# Patient Record
Sex: Female | Born: 1960
Health system: Southern US, Community
[De-identification: ages and names within clinical notes are randomized; demographics above are authoritative.]

## PROBLEM LIST (undated history)

## (undated) DIAGNOSIS — J45909 Unspecified asthma, uncomplicated: Secondary | ICD-10-CM

## (undated) DIAGNOSIS — R112 Nausea with vomiting, unspecified: Secondary | ICD-10-CM

## (undated) DIAGNOSIS — Z85828 Personal history of other malignant neoplasm of skin: Secondary | ICD-10-CM

## (undated) DIAGNOSIS — K219 Gastro-esophageal reflux disease without esophagitis: Secondary | ICD-10-CM

## (undated) DIAGNOSIS — M1990 Unspecified osteoarthritis, unspecified site: Secondary | ICD-10-CM

## (undated) DIAGNOSIS — T8859XA Other complications of anesthesia, initial encounter: Secondary | ICD-10-CM

## (undated) HISTORY — PX: CHOLECYSTECTOMY: SHX55

## (undated) HISTORY — DX: Personal history of other malignant neoplasm of skin: Z85.828

## (undated) HISTORY — PX: COLON SURGERY: SHX602

## (undated) HISTORY — PX: PLACEMENT OF BREAST IMPLANTS: SHX6334

## (undated) HISTORY — PX: APPENDECTOMY: SHX54

---

## 2006-05-08 DIAGNOSIS — C4491 Basal cell carcinoma of skin, unspecified: Secondary | ICD-10-CM | POA: Insufficient documentation

## 2006-07-09 DIAGNOSIS — R202 Paresthesia of skin: Secondary | ICD-10-CM | POA: Insufficient documentation

## 2006-11-03 HISTORY — PX: AUGMENTATION MAMMAPLASTY: SUR837

## 2007-11-04 DIAGNOSIS — Z85828 Personal history of other malignant neoplasm of skin: Secondary | ICD-10-CM

## 2007-11-04 HISTORY — DX: Personal history of other malignant neoplasm of skin: Z85.828

## 2011-07-10 DIAGNOSIS — J45909 Unspecified asthma, uncomplicated: Secondary | ICD-10-CM | POA: Insufficient documentation

## 2011-07-10 DIAGNOSIS — N943 Premenstrual tension syndrome: Secondary | ICD-10-CM | POA: Insufficient documentation

## 2011-07-10 DIAGNOSIS — K589 Irritable bowel syndrome without diarrhea: Secondary | ICD-10-CM | POA: Insufficient documentation

## 2011-10-11 DIAGNOSIS — D696 Thrombocytopenia, unspecified: Secondary | ICD-10-CM | POA: Insufficient documentation

## 2013-04-27 DIAGNOSIS — K118 Other diseases of salivary glands: Secondary | ICD-10-CM | POA: Insufficient documentation

## 2013-05-03 DIAGNOSIS — R7989 Other specified abnormal findings of blood chemistry: Secondary | ICD-10-CM | POA: Insufficient documentation

## 2013-08-22 DIAGNOSIS — Z87410 Personal history of cervical dysplasia: Secondary | ICD-10-CM | POA: Insufficient documentation

## 2013-12-22 DIAGNOSIS — R1013 Epigastric pain: Secondary | ICD-10-CM | POA: Insufficient documentation

## 2013-12-22 DIAGNOSIS — L719 Rosacea, unspecified: Secondary | ICD-10-CM | POA: Insufficient documentation

## 2013-12-22 DIAGNOSIS — K219 Gastro-esophageal reflux disease without esophagitis: Secondary | ICD-10-CM | POA: Insufficient documentation

## 2014-03-07 ENCOUNTER — Emergency Department: Payer: Self-pay | Admitting: Emergency Medicine

## 2014-03-07 LAB — CBC
HCT: 41.3 % (ref 35.0–47.0)
HGB: 13.7 g/dL (ref 12.0–16.0)
MCH: 30.8 pg (ref 26.0–34.0)
MCHC: 33.1 g/dL (ref 32.0–36.0)
MCV: 93 fL (ref 80–100)
Platelet: 140 10*3/uL — ABNORMAL LOW (ref 150–440)
RBC: 4.43 10*6/uL (ref 3.80–5.20)
RDW: 12.9 % (ref 11.5–14.5)
WBC: 4.2 10*3/uL (ref 3.6–11.0)

## 2014-03-07 LAB — BASIC METABOLIC PANEL
ANION GAP: 5 — AB (ref 7–16)
BUN: 20 mg/dL — ABNORMAL HIGH (ref 7–18)
CALCIUM: 8.5 mg/dL (ref 8.5–10.1)
CHLORIDE: 109 mmol/L — AB (ref 98–107)
CO2: 27 mmol/L (ref 21–32)
CREATININE: 0.92 mg/dL (ref 0.60–1.30)
EGFR (African American): >60
Glucose: 100 mg/dL — ABNORMAL HIGH (ref 65–99)
OSMOLALITY: 284 (ref 275–301)
Potassium: 3.6 mmol/L (ref 3.5–5.1)
SODIUM: 141 mmol/L (ref 136–145)

## 2014-03-07 LAB — TROPONIN I: Troponin-I: <0.02 ng/mL

## 2014-05-17 ENCOUNTER — Ambulatory Visit: Payer: Self-pay | Admitting: Family Medicine

## 2014-10-11 DIAGNOSIS — Z780 Asymptomatic menopausal state: Secondary | ICD-10-CM | POA: Insufficient documentation

## 2014-11-28 ENCOUNTER — Ambulatory Visit: Payer: Self-pay | Admitting: Family Medicine

## 2015-02-11 DIAGNOSIS — K5792 Diverticulitis of intestine, part unspecified, without perforation or abscess without bleeding: Secondary | ICD-10-CM | POA: Insufficient documentation

## 2015-06-25 DIAGNOSIS — Z8041 Family history of malignant neoplasm of ovary: Secondary | ICD-10-CM | POA: Insufficient documentation

## 2015-07-25 ENCOUNTER — Inpatient Hospital Stay: Payer: BLUE CROSS/BLUE SHIELD

## 2015-07-25 ENCOUNTER — Other Ambulatory Visit: Payer: Self-pay

## 2015-07-25 ENCOUNTER — Inpatient Hospital Stay
Admission: EM | Admit: 2015-07-25 | Discharge: 2015-07-27 | DRG: 390 | Disposition: A | Discharge status: Home / Self Care | Payer: BLUE CROSS/BLUE SHIELD | Attending: General Surgery | Admitting: General Surgery

## 2015-07-25 ENCOUNTER — Emergency Department: Payer: BLUE CROSS/BLUE SHIELD

## 2015-07-25 DIAGNOSIS — D259 Leiomyoma of uterus, unspecified: Secondary | ICD-10-CM | POA: Diagnosis present

## 2015-07-25 DIAGNOSIS — K5669 Other intestinal obstruction: Principal | ICD-10-CM

## 2015-07-25 DIAGNOSIS — J45909 Unspecified asthma, uncomplicated: Secondary | ICD-10-CM | POA: Diagnosis present

## 2015-07-25 DIAGNOSIS — K589 Irritable bowel syndrome without diarrhea: Secondary | ICD-10-CM | POA: Diagnosis present

## 2015-07-25 DIAGNOSIS — Z9049 Acquired absence of other specified parts of digestive tract: Secondary | ICD-10-CM | POA: Diagnosis present

## 2015-07-25 DIAGNOSIS — K56609 Unspecified intestinal obstruction, unspecified as to partial versus complete obstruction: Secondary | ICD-10-CM | POA: Diagnosis present

## 2015-07-25 HISTORY — DX: Unspecified asthma, uncomplicated: J45.909

## 2015-07-25 LAB — COMPREHENSIVE METABOLIC PANEL
ALK PHOS: 70 U/L (ref 38–126)
ALT: 19 U/L (ref 14–54)
AST: 30 U/L (ref 15–41)
Albumin: 4.3 g/dL (ref 3.5–5.0)
Anion gap: 7 (ref 5–15)
BUN: 12 mg/dL (ref 6–20)
CALCIUM: 9.2 mg/dL (ref 8.9–10.3)
CO2: 27 mmol/L (ref 22–32)
CREATININE: 0.88 mg/dL (ref 0.44–1.00)
Chloride: 108 mmol/L (ref 101–111)
Glucose, Bld: 137 mg/dL — ABNORMAL HIGH (ref 65–99)
Potassium: 3.5 mmol/L (ref 3.5–5.1)
SODIUM: 142 mmol/L (ref 135–145)
Total Bilirubin: 1.6 mg/dL — ABNORMAL HIGH (ref 0.3–1.2)
Total Protein: 7.2 g/dL (ref 6.5–8.1)

## 2015-07-25 LAB — CBC WITH DIFFERENTIAL/PLATELET
Basophils Absolute: 0 10*3/uL (ref 0–0.1)
Basophils Relative: 1 %
Eosinophils Absolute: 0.1 10*3/uL (ref 0–0.7)
Eosinophils Relative: 1 %
HCT: 43.2 % (ref 35.0–47.0)
HEMOGLOBIN: 14.7 g/dL (ref 12.0–16.0)
LYMPHS ABS: 1.1 10*3/uL (ref 1.0–3.6)
LYMPHS PCT: 23 %
MCH: 31.4 pg (ref 26.0–34.0)
MCHC: 34 g/dL (ref 32.0–36.0)
MCV: 92.3 fL (ref 80.0–100.0)
Monocytes Absolute: 0.2 10*3/uL (ref 0.2–0.9)
Monocytes Relative: 5 %
NEUTROS PCT: 70 %
Neutro Abs: 3.2 10*3/uL (ref 1.4–6.5)
Platelets: 147 10*3/uL — ABNORMAL LOW (ref 150–440)
RBC: 4.68 MIL/uL (ref 3.80–5.20)
RDW: 12.6 % (ref 11.5–14.5)
WBC: 4.6 10*3/uL (ref 3.6–11.0)

## 2015-07-25 LAB — URINALYSIS COMPLETE WITH MICROSCOPIC (ARMC ONLY)
BILIRUBIN URINE: NEGATIVE
Bacteria, UA: NONE SEEN
Glucose, UA: NEGATIVE mg/dL
Hgb urine dipstick: NEGATIVE
Ketones, ur: NEGATIVE mg/dL
Nitrite: NEGATIVE
PH: 9 — AB (ref 5.0–8.0)
PROTEIN: NEGATIVE mg/dL
RBC / HPF: NONE SEEN RBC/hpf (ref 0–5)
Specific Gravity, Urine: 1.013 (ref 1.005–1.030)

## 2015-07-25 LAB — LIPASE, BLOOD: Lipase: 33 U/L (ref 22–51)

## 2015-07-25 LAB — TROPONIN I

## 2015-07-25 MED ORDER — PROMETHAZINE HCL 25 MG/ML IJ SOLN
12.5000 mg | Freq: Once | INTRAMUSCULAR | Status: AC
  Administered 2015-07-25: 12.5 mg via INTRAVENOUS

## 2015-07-25 MED ORDER — SODIUM CHLORIDE 0.9 % IV SOLN: 8.0000 mg | Freq: Once | INTRAVENOUS | Status: AC

## 2015-07-25 MED ORDER — HYDROMORPHONE HCL 1 MG/ML IJ SOLN
INTRAMUSCULAR | Status: AC
  Administered 2015-07-25: 0.5 mg via INTRAVENOUS
  Filled 2015-07-25: qty 1

## 2015-07-25 MED ORDER — HYDROMORPHONE HCL 1 MG/ML IJ SOLN: 1.0000 mg | Freq: Once | INTRAMUSCULAR | Status: DC

## 2015-07-25 MED ORDER — ONDANSETRON HCL 4 MG/2ML IJ SOLN
INTRAMUSCULAR | Status: AC
  Administered 2015-07-25: 4 mg
  Filled 2015-07-25: qty 2

## 2015-07-25 MED ORDER — MORPHINE SULFATE (PF) 4 MG/ML IV SOLN
INTRAVENOUS | Status: AC
  Administered 2015-07-25: 4 mg via INTRAVENOUS
  Filled 2015-07-25: qty 1

## 2015-07-25 MED ORDER — ENOXAPARIN SODIUM 40 MG/0.4ML ~~LOC~~ SOLN
40.0000 mg | SUBCUTANEOUS | Status: DC
  Administered 2015-07-25 – 2015-07-27 (×3): 40 mg via SUBCUTANEOUS
  Filled 2015-07-25 (×3): qty 0.4

## 2015-07-25 MED ORDER — PROMETHAZINE HCL 25 MG/ML IJ SOLN
INTRAMUSCULAR | Status: AC
  Administered 2015-07-25: 12.5 mg via INTRAVENOUS
  Filled 2015-07-25: qty 1

## 2015-07-25 MED ORDER — ACETAMINOPHEN 650 MG RE SUPP
650.0000 mg | Freq: Four times a day (QID) | RECTAL | Status: DC | PRN
Start: 2015-07-25 — End: 2015-07-27

## 2015-07-25 MED ORDER — HYDROMORPHONE HCL 1 MG/ML IJ SOLN
INTRAMUSCULAR | Status: AC
  Administered 2015-07-25: 1 mg via INTRAVENOUS
  Filled 2015-07-25: qty 1

## 2015-07-25 MED ORDER — ONDANSETRON HCL 4 MG/2ML IJ SOLN
4.0000 mg | Freq: Four times a day (QID) | INTRAMUSCULAR | Status: DC | PRN
  Administered 2015-07-26: 4 mg via INTRAVENOUS
  Filled 2015-07-25 (×2): qty 2

## 2015-07-25 MED ORDER — HYDROMORPHONE HCL 1 MG/ML IJ SOLN
0.5000 mg | Freq: Once | INTRAMUSCULAR | Status: AC
  Administered 2015-07-25: 0.5 mg via INTRAVENOUS

## 2015-07-25 MED ORDER — DEXTROSE IN LACTATED RINGERS 5 % IV SOLN
INTRAVENOUS | Status: DC
  Administered 2015-07-25 – 2015-07-27 (×4): via INTRAVENOUS

## 2015-07-25 MED ORDER — HYDROMORPHONE HCL 1 MG/ML IJ SOLN
1.0000 mg | INTRAMUSCULAR | Status: DC | PRN
  Administered 2015-07-25 – 2015-07-27 (×9): 1 mg via INTRAVENOUS
  Filled 2015-07-25 (×8): qty 1

## 2015-07-25 MED ORDER — IOHEXOL 240 MG/ML SOLN
25.0000 mL | Freq: Once | INTRAMUSCULAR | Status: AC | PRN
  Administered 2015-07-25: 25 mL via ORAL

## 2015-07-25 MED ORDER — ACETAMINOPHEN 325 MG PO TABS
650.0000 mg | ORAL_TABLET | Freq: Four times a day (QID) | ORAL | Status: DC | PRN
Start: 2015-07-25 — End: 2015-07-27

## 2015-07-25 MED ORDER — ONDANSETRON HCL 4 MG PO TABS
4.0000 mg | ORAL_TABLET | Freq: Four times a day (QID) | ORAL | Status: DC | PRN
  Administered 2015-07-27: 4 mg via ORAL

## 2015-07-25 MED ORDER — MORPHINE SULFATE (PF) 4 MG/ML IV SOLN
4.0000 mg | Freq: Once | INTRAVENOUS | Status: AC
  Administered 2015-07-25: 4 mg via INTRAVENOUS

## 2015-07-25 MED ORDER — IOHEXOL 300 MG/ML  SOLN
100.0000 mL | Freq: Once | INTRAMUSCULAR | Status: AC | PRN
  Administered 2015-07-25: 100 mL via INTRAVENOUS

## 2015-07-25 NOTE — ED Notes (Addendum)
Pt calls out to report that she has vomited her contrast and having increased right sided upper abd pain, sharp and cramping; pt grimacing, restless; pt up to room commode to urinate; instructed on cc urine collection

## 2015-07-25 NOTE — ED Notes (Signed)
Pt returns to room, no distress noted; reports pain decreased 4/10 and much more comfortable; card monitor replaced

## 2015-07-25 NOTE — ED Notes (Signed)
Dr Owens Shark at bedside; pt placed on card monitor

## 2015-07-25 NOTE — ED Notes (Signed)
CT tech at bedside to bring pt new bottle contrast to attempt to drink

## 2015-07-25 NOTE — ED Notes (Signed)
Pt presents to ED with sudden onset of severe epigastric pain and vomiting (X2) that started approx 20 minutes ago. No hx of the same. Pt reports her gallbladder and appendix have been removed. Elective surgery approx 4 months ago to have a portion of her colon removed due to reoccurring diverticulitis. Nothing seems to make pain improve per pt. Pt very restless and moaning.

## 2015-07-25 NOTE — ED Notes (Signed)
Pt to CT via stretcher

## 2015-07-25 NOTE — H&P (Signed)
Kristy Gibson is an 54 y.o. female.     Chief Complaint: sudden onset abdominal pain and emesis  HPI: This is a 54 year old otherwise healthy white female with a history of recurrent complicated diverticulitis of the sigmoid colon who underwent laparoscopic-assisted sigmoid colectomy at [facility] in June of this year. Her postoperative course was unremarkable. She has a history of intermittent symptoms of IBS for which she is on prescription medication for. She also has a history of uterine fibroids.  She awoke from sleep at approximately 1:00 this morning going to bed feeling fine eating a salad the night before with no sick contacts with the severe sudden onset of diffuse abdominal pain more so in the upper abdomen associated with multiple episodes of emesis of gastric contents. She's brought by her husband to the emergency room. Workup in the emergency room demonstrates a distraught lady.  A CT scan was obtained with oral and intravenous contrast which I have reviewed demonstrating a proximal small bowel obstruction with obvious transition point. As such surgical services were contacted. Last bowel movement and passage of flatus was at 10:00 p.m. last evening.  Past Medical History  Diagnosis Date  . Asthma   IBS  Past surgical history: significant for laparoscopic-assisted sigmoid colectomy performed at [facility] in June 2016.   Laparoscopic cholecystectomy  Family history is negative For cancer on her mother's side.  Social History:  has no tobacco, alcohol, and drug history on file.  Allergies: No Known Allergies.   Review of Systems  Constitutional: Negative for fever, chills, weight loss and malaise/fatigue.  HENT: Negative.   Respiratory: Negative for cough and hemoptysis.   Cardiovascular: Negative for chest pain and palpitations.  Gastrointestinal: Positive for nausea, vomiting and abdominal pain. Negative for heartburn, diarrhea and  constipation.  Genitourinary: Negative.  Negative for dysuria and urgency.  Skin: Negative for itching and rash.  Neurological: Positive for weakness.  Endo/Heme/Allergies: Negative.   Psychiatric/Behavioral: Negative.     Physical Exam:  Physical Exam  Constitutional: She is oriented to person, place, and time. She appears distressed.  HENT:  Head: Normocephalic and atraumatic.  Eyes: Conjunctivae are normal. Pupils are equal, round, and reactive to light.  Neck: Normal range of motion. Neck supple.  Cardiovascular: Normal rate and regular rhythm.   Pulmonary/Chest: Breath sounds normal. No respiratory distress. She has no wheezes.  Abdominal: Normal appearance. She exhibits no distension and no mass. Bowel sounds are hyperactive. There is no hepatosplenomegaly. There is generalized tenderness. There is no rebound, no guarding and no CVA tenderness. No hernia.    Neurological: She is oriented to person, place, and time.  Skin: Skin is warm and dry. She is not diaphoretic.  Psychiatric: Mood, memory, affect and judgment normal.    Blood pressure 99/65, pulse 58, temperature 97.4 F (36.3 C), temperature source Oral, resp. rate 20, height 5' 4" (1.626 m), weight 130 lb (58.968 kg), SpO2 99 %.  Results for orders placed or performed during the hospital encounter of 07/25/15 (from the past 48 hour(s))  CBC WITH DIFFERENTIAL     Status: Abnormal   Collection Time: 07/25/15  3:28 AM  Result Value Ref Range   WBC 4.6 3.6 - 11.0 K/uL   RBC 4.68 3.80 - 5.20 MIL/uL   Hemoglobin 14.7 12.0 - 16.0 g/dL   HCT 43.2 35.0 - 47.0 %   MCV 92.3 80.0 - 100.0 fL   MCH 31.4 26.0 - 34.0 pg   MCHC 34.0  32.0 - 36.0 g/dL   RDW 12.6 11.5 - 14.5 %   Platelets 147 (L) 150 - 440 K/uL   Neutrophils Relative % 70 %   Neutro Abs 3.2 1.4 - 6.5 K/uL   Lymphocytes Relative 23 %   Lymphs Abs 1.1 1.0 - 3.6 K/uL   Monocytes Relative 5 %   Monocytes Absolute 0.2 0.2 - 0.9 K/uL   Eosinophils Relative 1 %    Eosinophils Absolute 0.1 0 - 0.7 K/uL   Basophils Relative 1 %   Basophils Absolute 0.0 0 - 0.1 K/uL  Comprehensive metabolic panel     Status: Abnormal   Collection Time: 07/25/15  3:28 AM  Result Value Ref Range   Sodium 142 135 - 145 mmol/L   Potassium 3.5 3.5 - 5.1 mmol/L   Chloride 108 101 - 111 mmol/L   CO2 27 22 - 32 mmol/L   Glucose, Bld 137 (H) 65 - 99 mg/dL   BUN 12 6 - 20 mg/dL   Creatinine, Ser 0.88 0.44 - 1.00 mg/dL   Calcium 9.2 8.9 - 10.3 mg/dL   Total Protein 7.2 6.5 - 8.1 g/dL   Albumin 4.3 3.5 - 5.0 g/dL   AST 30 15 - 41 U/L   ALT 19 14 - 54 U/L   Alkaline Phosphatase 70 38 - 126 U/L   Total Bilirubin 1.6 (H) 0.3 - 1.2 mg/dL   GFR calc non Af Amer >60 >60 mL/min   GFR calc Af Amer >60 >60 mL/min    Comment: (NOTE) The eGFR has been calculated using the CKD EPI equation. This calculation has not been validated in all clinical situations. eGFR's persistently <60 mL/min signify possible Chronic Kidney Disease.    Anion gap 7 5 - 15  Lipase, blood     Status: None   Collection Time: 07/25/15  3:28 AM  Result Value Ref Range   Lipase 33 22 - 51 U/L  Troponin I     Status: None   Collection Time: 07/25/15  3:28 AM  Result Value Ref Range   Troponin I <0.03 <0.031 ng/mL    Comment:        NO INDICATION OF MYOCARDIAL INJURY.   Urinalysis complete, with microscopic ([facility] only)     Status: Abnormal   Collection Time: 07/25/15  3:37 AM  Result Value Ref Range   Color, Urine YELLOW (A) YELLOW   APPearance CLOUDY (A) CLEAR   Glucose, UA NEGATIVE NEGATIVE mg/dL   Bilirubin Urine NEGATIVE NEGATIVE   Ketones, ur NEGATIVE NEGATIVE mg/dL   Specific Gravity, Urine 1.013 1.005 - 1.030   Hgb urine dipstick NEGATIVE NEGATIVE   pH 9.0 (H) 5.0 - 8.0   Protein, ur NEGATIVE NEGATIVE mg/dL   Nitrite NEGATIVE NEGATIVE   Leukocytes, UA TRACE (A) NEGATIVE   RBC / HPF NONE SEEN 0 - 5 RBC/hpf   WBC, UA 0-5 0 - 5 WBC/hpf   Bacteria, UA NONE SEEN NONE SEEN   Squamous  Epithelial / LPF 0-5 (A) NONE SEEN   Mucous PRESENT    Amorphous Crystal PRESENT    Ct Abdomen Pelvis W Contrast  07/25/2015   CLINICAL DATA:  Generalized abdominal pain with vomiting.  EXAM: CT ABDOMEN AND PELVIS WITH CONTRAST  TECHNIQUE: Multidetector CT imaging of the abdomen and pelvis was performed using the standard protocol following bolus administration of intravenous contrast.  CONTRAST:  25mL OMNIPAQUE IOHEXOL 240 MG/ML SOLN, 100mL OMNIPAQUE IOHEXOL 300 MG/ML SOLN  COMPARISON:  11/28/2014  FINDINGS:   Lower chest and abdominal wall:  No contributory findings.  Hepatobiliary: Benign-appearing tiny low densities in the liver which could reflect cysts or biliary hamartomas.Cholecystectomy with stable common bile duct at 7 mm. Low insertion cystic duct, joining at the upper pancreatic head. No visible choledocholithiasis.  Pancreas: Unremarkable.  Spleen: Unremarkable.  Adrenals/Urinary Tract: Negative adrenals. No hydronephrosis or stone. An 11 mm mass contiguous with the lower pole kidney is indeterminate between colonic diverticulum or renal carcinoma. The nodule is newly seen and does not have a clear renal epicenter on reformats. These features favors diverticulum with high-density debris, but no other diverticula in the region and no diverticula at this location appearance on previous CT.  Unremarkable bladder.  Reproductive:Prominent left ovarian vein, with appearance and partial opacification stable from prior. Multiple uterine fibroids, largest measuring 23 mm. No adnexal mass seen.  Stomach/Bowel: There is a cluster of small bowel in the left upper quadrant with angulation and mesenteric congestion/edema. Small bowel loops proximal to this area have abrupt transition and distention with fluid level. Transition best visualized on series 7, image 106 and on series 4, image 19 . No necrotic appearing bowel. Small pelvic fluid, likely reactive.  Interval sigmoid resection.  Vascular/Lymphatic: No  acute vascular abnormality. No mass or adenopathy.  Peritoneal: No pneumoperitoneum.  Musculoskeletal: No acute abnormalities.  IMPRESSION: 1. Proximal small bowel obstruction with transition point in the left upper quadrant, internal hernia versus adhesion. Small bowel distention is mild, but likely from early obstruction as the transition point appears high-grade. 2. 11 mm mass at the lower pole right kidney, indeterminate as discussed above. Recommend renal protocol MRI after convalescence.   Electronically Signed   By: Jonathon  Watts M.D.   On: 07/25/2015 05:15     Assessment/Plan 54-year-old white female with what appears to be proximal jejunal adhesive small bowel obstruction. Normal white count. Nasogastric tube has been placed. I do not find an indication for acute surgical intervention at this time. I discussed with the patient and her husband the possibility of her needing an operative intervention based on the time course and progression of her symptoms over the next 24-36 hours. I told him that transfer to [facility] if indeed they want any type of surgery done there would be much easier done through the emergency room rather than at the time the decision for surgery is made along her admission to this hospital. They understand and wish to be admitted to this hospital with the understanding that there is a high chance that nonoperative intervention will work. I did describe to him however that time course of events and with the appearance of the CT scan is impossible to predict whether nonoperative therapy will be successful 100% at this time.  Mark A Bird, MD, FACS 

## 2015-07-25 NOTE — ED Provider Notes (Signed)
[facility] Emergency Department Provider Note  ____________________________________________  Time seen: 2:45 AM  I have reviewed the triage vital signs and the nursing notes.   HISTORY  Chief Complaint Abdominal Pain      HPI Coraline Talwar is a 54 y.o. female presents with acute onset of generalized abdominal pain and vomiting 2 with onset 20 minutes ago. Patient rates that she had elective partial colectomy performed 4 months ago secondary to diverticulitis at [facility]. Patient denies any alleviating or aggravating factors for her pain. Patient states that she had a CAT scan performed 2 months ago for ongoing abdominal pain following her surgery which did not reveal any clear etiology for her pain.     Past medical history Appendicitis Diverticulitis There are no active problems to display for this patient.   Past surgical history Appendectomy Cholecystectomy Hemicolectomy No current outpatient prescriptions on file.  Allergies No known drug allergies No family history on file.  Social History Social History  Substance Use Topics  . Smoking status: Not on file  . Smokeless tobacco: Not on file  . Alcohol Use: Not on file    Review of Systems  Constitutional: Negative for fever. Eyes: Negative for visual changes. ENT: Negative for sore throat. Cardiovascular: Negative for chest pain. Respiratory: Negative for shortness of breath. Gastrointestinal: Positive for abdominal pain, vomiting. Genitourinary: Negative for dysuria. Musculoskeletal: Negative for back pain. Skin: Negative for rash. Neurological: Negative for headaches, focal weakness or numbness.   10-point ROS otherwise negative.  ____________________________________________   PHYSICAL EXAM:  VITAL SIGNS: ED Triage Vitals  Enc Vitals Group     BP 07/25/15 0204 132/64 mmHg     Pulse Rate 07/25/15 0204 63     Resp 07/25/15 0204 24     Temp 07/25/15 0204  97.4 F (36.3 C)     Temp Source 07/25/15 0204 Oral     SpO2 07/25/15 0204 100 %     Weight 07/25/15 0204 130 lb (58.968 kg)     Height 07/25/15 0204 5\' 4"  (1.626 m)     Head Cir --      Peak Flow --      Pain Score 07/25/15 0205 10     Pain Loc --      Pain Edu? --      Excl. in [place]? --     Constitutional: Alert and oriented. Well appearing and in no distress. Eyes: Conjunctivae are normal. PERRL. Normal extraocular movements. ENT   Head: Normocephalic and atraumatic.   Nose: No congestion/rhinnorhea.   Mouth/Throat: Mucous membranes are moist.   Neck: No stridor. Hematological/Lymphatic/Immunilogical: No cervical lymphadenopathy. Cardiovascular: Normal rate, regular rhythm. Normal and symmetric distal pulses are present in all extremities. No murmurs, rubs, or gallops. Respiratory: Normal respiratory effort without tachypnea nor retractions. Breath sounds are clear and equal bilaterally. No wheezes/rales/rhonchi. Gastrointestinal: Positive for generalized abdominal pain with palpation. No distention. There is no CVA tenderness. Genitourinary: deferred Musculoskeletal: Nontender with normal range of motion in all extremities. No joint effusions.  No lower extremity tenderness nor edema. Neurologic:  Normal speech and language. No gross focal neurologic deficits are appreciated. Speech is normal.  Skin:  Skin is warm, dry and intact. No rash noted. Psychiatric: Mood and affect are normal. Speech and behavior are normal. Patient exhibits appropriate insight and judgment.  ____________________________________________    LABS (pertinent positives/negatives)  Labs Reviewed  CBC WITH DIFFERENTIAL/PLATELET - Abnormal; Notable for the following:    Platelets 147 (*)  All other components within normal limits  COMPREHENSIVE METABOLIC PANEL - Abnormal; Notable for the following:    Glucose, Bld 137 (*)    Total Bilirubin 1.6 (*)    All other components within normal  limits  URINALYSIS COMPLETEWITH MICROSCOPIC ([facility] ONLY) - Abnormal; Notable for the following:    Color, Urine YELLOW (*)    APPearance CLOUDY (*)    pH 9.0 (*)    Leukocytes, UA TRACE (*)    Squamous Epithelial / LPF 0-5 (*)    All other components within normal limits  LIPASE, BLOOD  TROPONIN I     ____________________________________________   EKG  ED ECG REPORT I, BROWN, [place] N, the attending physician, personally viewed and interpreted this ECG.   Date: 07/25/2015  EKG Time: 3:44 AM  Rate: 57  Rhythm: Sinus bradycardia  Axis: None  Intervals: Normal  ST&T Change: None   ____________________________________________    RADIOLOGY CT Abdomen Pelvis W Contrast (Final result) Result time: 07/25/15 05:15:06   Final result by Rad Results In Interface (07/25/15 05:15:06)   Narrative:   CLINICAL DATA: Generalized abdominal pain with vomiting.  EXAM: CT ABDOMEN AND PELVIS WITH CONTRAST  TECHNIQUE: Multidetector CT imaging of the abdomen and pelvis was performed using the standard protocol following bolus administration of intravenous contrast.  CONTRAST: 13mL OMNIPAQUE IOHEXOL 240 MG/ML SOLN, 162mL OMNIPAQUE IOHEXOL 300 MG/ML SOLN  COMPARISON: 11/28/2014  FINDINGS: Lower chest and abdominal wall: No contributory findings.  Hepatobiliary: Benign-appearing tiny low densities in the liver which could reflect cysts or biliary hamartomas.Cholecystectomy with stable common bile duct at 7 mm. Low insertion cystic duct, joining at the upper pancreatic head. No visible choledocholithiasis.  Pancreas: Unremarkable.  Spleen: Unremarkable.  Adrenals/Urinary Tract: Negative adrenals. No hydronephrosis or stone. An 11 mm mass contiguous with the lower pole kidney is indeterminate between colonic diverticulum or renal carcinoma. The nodule is newly seen and does not have a clear renal epicenter on reformats. These features favors diverticulum with  high-density debris, but no other diverticula in the region and no diverticula at this location appearance on previous CT.  Unremarkable bladder.  Reproductive:Prominent left ovarian vein, with appearance and partial opacification stable from prior. Multiple uterine fibroids, largest measuring 23 mm. No adnexal mass seen.  Stomach/Bowel: There is a cluster of small bowel in the left upper quadrant with angulation and mesenteric congestion/edema. Small bowel loops proximal to this area have abrupt transition and distention with fluid level. Transition best visualized on series 7, image 106 and on series 4, image 19 . No necrotic appearing bowel. Small pelvic fluid, likely reactive.  Interval sigmoid resection.  Vascular/Lymphatic: No acute vascular abnormality. No mass or adenopathy.  Peritoneal: No pneumoperitoneum.  Musculoskeletal: No acute abnormalities.  IMPRESSION: 1. Proximal small bowel obstruction with transition point in the left upper quadrant, internal hernia versus adhesion. Small bowel distention is mild, but likely from early obstruction as the transition point appears high-grade. 2. 11 mm mass at the lower pole right kidney, indeterminate as discussed above. Recommend renal protocol MRI after convalescence.   Electronically Signed By: Monte Fantasia M.D. On: 07/25/2015 05:15      INITIAL IMPRESSION / ASSESSMENT AND PLAN / ED COURSE  Pertinent labs & imaging results that were available during my care of the patient were reviewed by me and considered in my medical decision making (see chart for details).  Patient discussed with Dr. Felton Clinton at 5:45 AM regarding bowel obstruction. He will evaluate the patient emergency department.  ____________________________________________  FINAL CLINICAL IMPRESSION(S) / ED DIAGNOSES  Final diagnoses:  Small bowel obstruction      Gregor Hams, MD 07/25/15 [phone]

## 2015-07-25 NOTE — Progress Notes (Signed)
Place patients NG tube on Low intermittent suction per Dr. Adonis Huguenin

## 2015-07-26 LAB — CBC
HEMATOCRIT: 37.7 % (ref 35.0–47.0)
Hemoglobin: 12.5 g/dL (ref 12.0–16.0)
MCH: 30.7 pg (ref 26.0–34.0)
MCHC: 33.2 g/dL (ref 32.0–36.0)
MCV: 92.6 fL (ref 80.0–100.0)
Platelets: 128 10*3/uL — ABNORMAL LOW (ref 150–440)
RBC: 4.07 MIL/uL (ref 3.80–5.20)
RDW: 12.7 % (ref 11.5–14.5)
WBC: 5.6 10*3/uL (ref 3.6–11.0)

## 2015-07-26 LAB — COMPREHENSIVE METABOLIC PANEL
ALBUMIN: 3.4 g/dL — AB (ref 3.5–5.0)
ALT: 14 U/L (ref 14–54)
AST: 17 U/L (ref 15–41)
Alkaline Phosphatase: 54 U/L (ref 38–126)
Anion gap: 4 — ABNORMAL LOW (ref 5–15)
BUN: 8 mg/dL (ref 6–20)
CHLORIDE: 108 mmol/L (ref 101–111)
CO2: 29 mmol/L (ref 22–32)
CREATININE: 0.87 mg/dL (ref 0.44–1.00)
Calcium: 8.6 mg/dL — ABNORMAL LOW (ref 8.9–10.3)
GFR calc Af Amer: >60 mL/min (ref >60)
Glucose, Bld: 116 mg/dL — ABNORMAL HIGH (ref 65–99)
POTASSIUM: 3.8 mmol/L (ref 3.5–5.1)
SODIUM: 141 mmol/L (ref 135–145)
Total Bilirubin: 1.7 mg/dL — ABNORMAL HIGH (ref 0.3–1.2)
Total Protein: 5.8 g/dL — ABNORMAL LOW (ref 6.5–8.1)

## 2015-07-26 MED ORDER — ACETAMINOPHEN 10 MG/ML IV SOLN
1000.0000 mg | Freq: Two times a day (BID) | INTRAVENOUS | Status: AC | PRN
  Administered 2015-07-26 – 2015-07-27 (×2): 1000 mg via INTRAVENOUS
  Filled 2015-07-26 (×3): qty 100

## 2015-07-26 NOTE — Progress Notes (Signed)
CC: Bowel obstruction Subjective: 54 year old female with proximal small bowel obstruction. Patient states that she continues to feel well with the NG tube decompression. She denies any nausea, vomiting. She has been passing flatus and has the urge for bowel function. Otherwise no complaints this morning.  Objective: Vital signs in last 24 hours: Temp:  [99.5 F (37.5 C)-99.6 F (37.6 C)] 99.5 F (37.5 C) (09/22 0508) Pulse Rate:  [54-62] 62 (09/22 0508) Resp:  [16] 16 (09/22 0508) BP: (101-103)/(53-61) 101/53 mmHg (09/22 0508) SpO2:  [93 %-98 %] 93 % (09/22 0508) Weight:  [60.011 kg (132 lb 4.8 oz)] 60.011 kg (132 lb 4.8 oz) (09/22 0508) Last BM Date: 07/24/15  Intake/Output from previous day: 09/21 0701 - 09/22 0700 In: 1595 [I.V.:1595] Out: 1600 [Urine:800; Emesis/NG output:800] Intake/Output this shift: Total I/O In: 2069 [I.V.:2069] Out: 400 [Emesis/NG output:400]  Physical exam:  Gen.: No acute distress neck signed head: NG tube in place Chest: Clear to auscultation regular rate and rhythm Abdomen: Soft, nondistended. Mildly tender to palpation in the left lower quadrant.  Lab Results: CBC   Recent Labs  07/25/15 0328 07/26/15 0501  WBC 4.6 5.6  HGB 14.7 12.5  HCT 43.2 37.7  PLT 147* 128*   BMET  Recent Labs  07/25/15 0328 07/26/15 0501  NA 142 141  K 3.5 3.8  CL 108 108  CO2 27 29  GLUCOSE 137* 116*  BUN 12 8  CREATININE 0.88 0.87  CALCIUM 9.2 8.6*   PT/INR No results for input(s): LABPROT, INR in the last 72 hours. ABG No results for input(s): PHART, HCO3 in the last 72 hours.  Invalid input(s): PCO2, PO2  Studies/Results: Ct Abdomen Pelvis W Contrast  07/25/2015   CLINICAL DATA:  Generalized abdominal pain with vomiting.  EXAM: CT ABDOMEN AND PELVIS WITH CONTRAST  TECHNIQUE: Multidetector CT imaging of the abdomen and pelvis was performed using the standard protocol following bolus administration of intravenous contrast.  CONTRAST:  96mL  OMNIPAQUE IOHEXOL 240 MG/ML SOLN, 117mL OMNIPAQUE IOHEXOL 300 MG/ML SOLN  COMPARISON:  11/28/2014  FINDINGS: Lower chest and abdominal wall:  No contributory findings.  Hepatobiliary: Benign-appearing tiny low densities in the liver which could reflect cysts or biliary hamartomas.Cholecystectomy with stable common bile duct at 7 mm. Low insertion cystic duct, joining at the upper pancreatic head. No visible choledocholithiasis.  Pancreas: Unremarkable.  Spleen: Unremarkable.  Adrenals/Urinary Tract: Negative adrenals. No hydronephrosis or stone. An 11 mm mass contiguous with the lower pole kidney is indeterminate between colonic diverticulum or renal carcinoma. The nodule is newly seen and does not have a clear renal epicenter on reformats. These features favors diverticulum with high-density debris, but no other diverticula in the region and no diverticula at this location appearance on previous CT.  Unremarkable bladder.  Reproductive:Prominent left ovarian vein, with appearance and partial opacification stable from prior. Multiple uterine fibroids, largest measuring 23 mm. No adnexal mass seen.  Stomach/Bowel: There is a cluster of small bowel in the left upper quadrant with angulation and mesenteric congestion/edema. Small bowel loops proximal to this area have abrupt transition and distention with fluid level. Transition best visualized on series 7, image 106 and on series 4, image 19 . No necrotic appearing bowel. Small pelvic fluid, likely reactive.  Interval sigmoid resection.  Vascular/Lymphatic: No acute vascular abnormality. No mass or adenopathy.  Peritoneal: No pneumoperitoneum.  Musculoskeletal: No acute abnormalities.  IMPRESSION: 1. Proximal small bowel obstruction with transition point in the left upper quadrant, internal hernia versus  adhesion. Small bowel distention is mild, but likely from early obstruction as the transition point appears high-grade. 2. 11 mm mass at the lower pole right kidney,  indeterminate as discussed above. Recommend renal protocol MRI after convalescence.   Electronically Signed   By: Monte Fantasia M.D.   On: 07/25/2015 05:15   Acute Abdominal Series  07/25/2015   CLINICAL DATA:  Small bowel obstruction. Small bowel resection in June 2016  EXAM: DG ABDOMEN ACUTE W/ 1V CHEST  COMPARISON:  07/25/2015  FINDINGS: Nasogastric tube tip: Stomach body. The lungs appear clear. Cardiac and mediastinal contours normal. No pleural effusion identified.  Contrast medium is present in the bladder and visible in the right renal collecting system. There is contrast medium in the left-sided loops of small bowel. This is not made its way to the colon.  There are several air- fluid levels in borderline dilated left-sided small bowel loops. Formed stool in the colon.  IMPRESSION: 1. Borderline dilated small bowel loops with air- fluid levels, abnormal but nonspecific, potentially from low-grade small bowel obstruction or ileus. Oral contrast medium has not made its way through to the colon. 2. Nasogastric tube tip:  Stomach body.   Electronically Signed   By: Van Clines M.D.   On: 07/25/2015 13:06    Anti-infectives: Anti-infectives    None      Assessment/Plan:  54 year old female with a proximal small bowel obstruction. Again discussed with the patient and her family this is likely related to her surgery she had a [facility] earlier this summer. NG tube decompression has provided relief of her symptoms. However, due to the continued level of output at this time we will continue to have NG tube to suction. Continue IV fluids and bowel rest Lovenox for DVT prophylaxis  Iden Stripling T. Adonis Huguenin, MD, FACS  07/26/2015

## 2015-07-27 MED ORDER — ONDANSETRON HCL 4 MG PO TABS: 4.0000 mg | ORAL_TABLET | Freq: Four times a day (QID) | ORAL | Status: DC | PRN

## 2015-07-27 NOTE — Progress Notes (Signed)
   NGT put back to suction after 5 hours of being clamped. Less than 64ml of gastric contents removed.  Given this finding the NGT was removed at this time. Will continue NPO for a few hours to insure Nausea/Vomiting does not recur Clear liquid diet after that  Clayburn Pert, MD [facility]

## 2015-07-27 NOTE — Discharge Instructions (Signed)
Small Bowel Obstruction °A small bowel obstruction is a blockage (obstruction) of the small intestine (small bowel). The small bowel is a long, slender tube that connects the stomach to the colon. Its job is to absorb nutrients from the fluids and foods you consume into the bloodstream.  °CAUSES  °There are many causes of intestinal blockage. The most common ones include: °· Hernias. This is a more common cause in children than adults. °· Inflammatory bowel disease (enteritis and colitis). °· Twisting of the bowel (volvulus). °· Tumors. °· Scar tissue (adhesions) from previous surgery or radiation treatment. °· Recent surgery. This may cause an acute small bowel obstruction called an ileus. °SYMPTOMS  °· Abdominal pain. This may be dull cramps or sharp pain. It may occur in one area or may be present in the entire abdomen. Pain can range from mild to severe, depending on the degree of obstruction. °· Nausea and vomiting. Vomit may be greenish or yellow bile color. °· Distended or swollen stomach. Abdominal bloating is a common symptom. °· Constipation. °· Lack of passing gas. °· Frequent belching. °· Diarrhea. This may occur if runny stool is able to leak around the obstruction. °DIAGNOSIS  °Your caregiver can usually diagnose small bowel obstruction by taking a history, doing a physical exam, and taking X-rays. If the cause is unclear, a CT scan (computerized tomography) of your abdomen and pelvis may be needed. °TREATMENT  °Treatment of the blockage depends on the cause and how bad the problem is.  °· Sometimes, the obstruction improves with bed rest and intravenous (IV) fluids. °· Resting the bowel is very important. This means following a simple diet. Sometimes, a clear liquid diet may be required for several days. °· Sometimes, a small tube (nasogastric tube) is placed into the stomach to decompress the bowel. When the bowel is blocked, it usually swells up like a balloon filled with air and fluids.  Decompression means that the air and fluids are removed by suction through that tube. This can help with pain, discomfort, and nausea. It can also help the obstruction resolve faster. °· Surgery may be required if other treatments do not work. Bowel obstruction from a hernia may require early surgery and can be an emergency procedure. Adhesions that cause frequent or severe obstructions may also require surgery. °HOME CARE INSTRUCTIONS °If your bowel obstruction is only partial or incomplete, you may be allowed to go home. °· Get plenty of rest. °· Follow your diet as directed by your caregiver. °· Only consume clear liquids until your condition improves. °· Avoid solid foods as instructed. °SEEK IMMEDIATE MEDICAL CARE IF: °· You have increased pain or cramping. °· You vomit blood. °· You have uncontrolled vomiting or nausea. °· You cannot drink fluids due to vomiting or pain. °· You develop confusion. °· You begin feeling very dry or thirsty (dehydrated). °· You have severe bloating. °· You have chills. °· You have a fever. °· You feel extremely weak or you faint. °MAKE SURE YOU: °· Understand these instructions. °· Will watch your condition. °· Will get help right away if you are not doing well or get worse. °Document Released: 01/06/2006 Document Revised: 01/12/2012 Document Reviewed: 01/03/2011 °ExitCare® Patient Information ©2015 [employer]. This information is not intended to replace advice given to you by your health care provider. Make sure you discuss any questions you have with your health care provider. ° °

## 2015-07-27 NOTE — Progress Notes (Signed)
CC: Small bowel obstruction Subjective: 54 year old female admitted with a small bowel obstruction. Patient reports feeling fine this morning his only complaint is from the NG tube. Denies any nausea, vomiting. She has had bowel function since admission.  Objective: Vital signs in last 24 hours: Temp:  [98.2 F (36.8 C)-98.9 F (37.2 C)] 98.5 F (36.9 C) (09/23 0535) Pulse Rate:  [52-59] 59 (09/23 0535) Resp:  [17-20] 17 (09/23 0535) BP: (115-131)/(64-70) 131/69 mmHg (09/23 0535) SpO2:  [96 %-98 %] 98 % (09/23 0535) Weight:  [60.555 kg (133 lb 8 oz)] 60.555 kg (133 lb 8 oz) (09/23 0628) Last BM Date: 07/24/15  Intake/Output from previous day: 09/22 0701 - 09/23 0700 In: 4046 [I.V.:3796; NG/GT:150; IV Piggyback:100] Out: 2675 [Urine:1775; Emesis/NG output:900] Intake/Output this shift: Total I/O In: 699 [I.V.:569; NG/GT:30; IV Piggyback:100] Out: -   Physical exam:  Gen.: No acute distress Chest: Clear to auscultation regular rate and rhythm Abdomen: Soft, nontender, nondistended.  Lab Results: CBC   Recent Labs  07/25/15 0328 07/26/15 0501  WBC 4.6 5.6  HGB 14.7 12.5  HCT 43.2 37.7  PLT 147* 128*   BMET  Recent Labs  07/25/15 0328 07/26/15 0501  NA 142 141  K 3.5 3.8  CL 108 108  CO2 27 29  GLUCOSE 137* 116*  BUN 12 8  CREATININE 0.88 0.87  CALCIUM 9.2 8.6*   PT/INR No results for input(s): LABPROT, INR in the last 72 hours. ABG No results for input(s): PHART, HCO3 in the last 72 hours.  Invalid input(s): PCO2, PO2  Studies/Results: Acute Abdominal Series  07/25/2015   CLINICAL DATA:  Small bowel obstruction. Small bowel resection in June 2016  EXAM: DG ABDOMEN ACUTE W/ 1V CHEST  COMPARISON:  07/25/2015  FINDINGS: Nasogastric tube tip: Stomach body. The lungs appear clear. Cardiac and mediastinal contours normal. No pleural effusion identified.  Contrast medium is present in the bladder and visible in the right renal collecting system. There is  contrast medium in the left-sided loops of small bowel. This is not made its way to the colon.  There are several air- fluid levels in borderline dilated left-sided small bowel loops. Formed stool in the colon.  IMPRESSION: 1. Borderline dilated small bowel loops with air- fluid levels, abnormal but nonspecific, potentially from low-grade small bowel obstruction or ileus. Oral contrast medium has not made its way through to the colon. 2. Nasogastric tube tip:  Stomach body.   Electronically Signed   By: Van Clines M.D.   On: 07/25/2015 13:06    Anti-infectives: Anti-infectives    None      Assessment/Plan:  54 year old female small bowel obstruction. Clinically resolved with NG tube decompression. NG tube output marginal for discontinuing his NG tube. Discussed performing a trial of NG tube clamping. We'll clamp NG tube for 5 hours and then reassess to suction. His NG tube output remains below 200 mL we will remove NG tube. Once able tolerate a diet she'll be able to go home.  Charles T. Adonis Huguenin, MD, FACS  07/27/2015

## 2015-07-27 NOTE — Discharge Summary (Signed)
Patient ID: Kristy Gibson MRN: [id number] DOB/AGE: [birth year] 54 y.o.  Admit date: 07/25/2015 Discharge date: 07/27/2015  Discharge Diagnoses:  Small bowel obstruction  Procedures Performed: NG tube decompression  Discharged Condition: good  Hospital Course: Patient admitted from the emergency department with a diagnosis of small bowel obstruction. She is treated conservatively with an NG tube decompression. Prior to discharge she had decreased NG tube output which allowed for removal of the G-tube. She tolerated by mouth liquids without any nausea or vomiting prior to discharge.  Discharge Orders:  discharge home, slowly return to normal diet  Disposition: Discharge home  Discharge Medications:   .  ondansetron (ZOFRAN) tablet 4 mg, 4 mg, Oral, Q6H PRN, 4 mg at 07/27/15 1148   Follwup: Follow-up Information    Follow up with [facility].   Why:  As needed, If symptoms worsen   Contact information:   [place] [phone]      Signed: Clayburn Pert 07/27/2015, 6:31 PM

## 2015-08-10 ENCOUNTER — Other Ambulatory Visit: Payer: Self-pay | Admitting: Gastroenterology

## 2015-08-10 DIAGNOSIS — Z8719 Personal history of other diseases of the digestive system: Secondary | ICD-10-CM

## 2015-10-09 ENCOUNTER — Other Ambulatory Visit: Payer: BLUE CROSS/BLUE SHIELD

## 2015-10-09 ENCOUNTER — Inpatient Hospital Stay: Admission: RE | Admit: 2015-10-09 | Payer: BLUE CROSS/BLUE SHIELD | Source: Ambulatory Visit

## 2015-10-18 ENCOUNTER — Ambulatory Visit
Admission: RE | Admit: 2015-10-18 | Discharge: 2015-10-18 | Disposition: A | Discharge status: Home / Self Care | Payer: BLUE CROSS/BLUE SHIELD | Source: Ambulatory Visit | Attending: Gastroenterology | Admitting: Gastroenterology

## 2015-10-18 DIAGNOSIS — Z8719 Personal history of other diseases of the digestive system: Secondary | ICD-10-CM

## 2015-10-18 MED ORDER — GADOBENATE DIMEGLUMINE 529 MG/ML IV SOLN
12.0000 mL | Freq: Once | INTRAVENOUS | Status: AC | PRN
  Administered 2015-10-18: 12 mL via INTRAVENOUS

## 2015-10-24 ENCOUNTER — Other Ambulatory Visit: Payer: Self-pay | Admitting: Gastroenterology

## 2015-10-24 DIAGNOSIS — R110 Nausea: Secondary | ICD-10-CM

## 2015-10-24 DIAGNOSIS — R6881 Early satiety: Secondary | ICD-10-CM

## 2015-10-31 ENCOUNTER — Encounter
Admission: RE | Admit: 2015-10-31 | Discharge: 2015-10-31 | Disposition: A | Discharge status: Home / Self Care | Payer: BLUE CROSS/BLUE SHIELD | Source: Ambulatory Visit | Attending: Gastroenterology | Admitting: Gastroenterology

## 2015-10-31 DIAGNOSIS — R110 Nausea: Secondary | ICD-10-CM

## 2015-10-31 DIAGNOSIS — R6881 Early satiety: Secondary | ICD-10-CM | POA: Insufficient documentation

## 2015-10-31 MED ORDER — TECHNETIUM TC 99M SULFUR COLLOID
1.9980 | Freq: Once | INTRAVENOUS | Status: AC | PRN
  Administered 2015-10-31: 1.998 via ORAL

## 2017-02-13 ENCOUNTER — Other Ambulatory Visit: Payer: Self-pay | Admitting: Family Medicine

## 2017-02-13 DIAGNOSIS — Z1231 Encounter for screening mammogram for malignant neoplasm of breast: Secondary | ICD-10-CM

## 2017-03-09 ENCOUNTER — Other Ambulatory Visit: Payer: Self-pay | Admitting: Family Medicine

## 2017-03-09 ENCOUNTER — Ambulatory Visit
Admission: RE | Admit: 2017-03-09 | Discharge: 2017-03-09 | Disposition: A | Discharge status: Home / Self Care | Payer: BLUE CROSS/BLUE SHIELD | Source: Ambulatory Visit | Attending: Family Medicine | Admitting: Family Medicine

## 2017-03-09 DIAGNOSIS — Z1231 Encounter for screening mammogram for malignant neoplasm of breast: Secondary | ICD-10-CM

## 2017-06-12 DIAGNOSIS — D110 Benign neoplasm of parotid gland: Secondary | ICD-10-CM | POA: Insufficient documentation

## 2017-12-14 IMAGING — MR MR [PERSON_NAME] PELVIS W/CM
11 of 16 series · 30 of 48 positions shown · IV contrast (12 ml multihance)
Comparison: CT 07/25/2015

CLINICAL DATA: Patient status post sigmoid resection for
diverticulitis. Interim mild small bowel obstruction which resolved
with conservative therapy.

EXAM:
MR ABDOMEN AND PELVIS WITHOUT AND WITH CONTRAST (MR ENTEROGRAPHY)
TECHNIQUE: Multiplanar, multisequence MRI of the abdomen and pelvis was
performed both before and during bolus administration of intravenous
contrast. Negative oral contrast VoLumen was given.
CONTRAST:  12mL MULTIHANCE GADOBENATE DIMEGLUMINE 529 MG/ML IV SOLN

[Series 4: axial haste · axial · 6.5mm · 0.74mm/px · 1 of 47 slices shown]
[im 1/47]
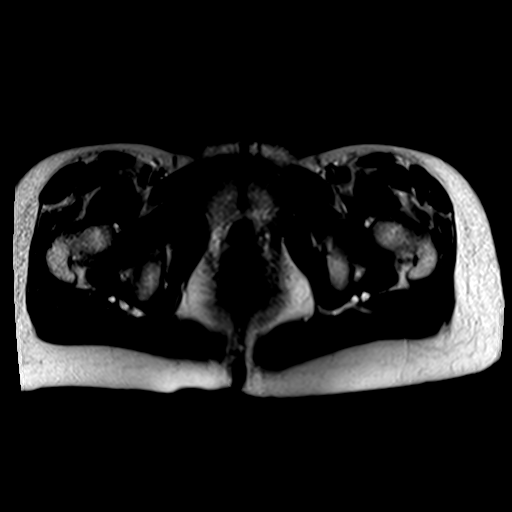

[Series 5: [id number] · axial · 7.0mm · 1.88mm/px · z∈[-173,+195]mm · 4 of 128 slices shown]
[im 1/128]
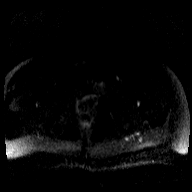
[im 43/128]
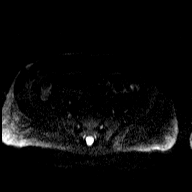
[im 85/128]
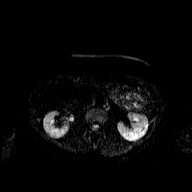
[im 128/128]
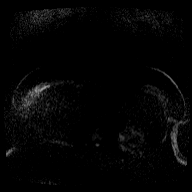

[Series 6: [id number] · axial · 7.0mm · 1.88mm/px · 1 of 43 slices shown]
[im 1/43]
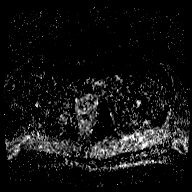

[Series 7: cor haste · coronal · 5.0mm · 0.74mm/px · 1 of 31 slices shown]
[im 1/31]
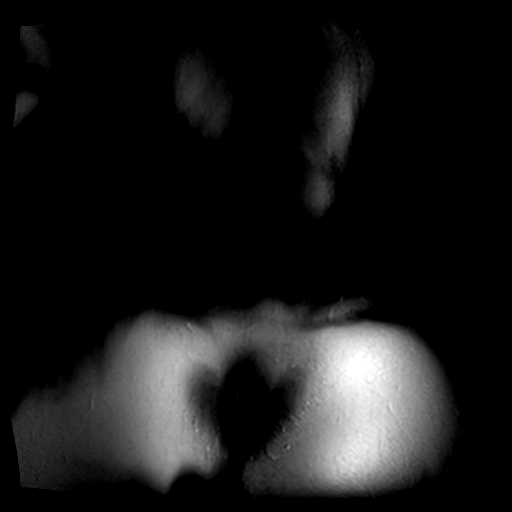

[Series 8: bSSFP · coronal · 5.0mm · 0.74mm/px · 1 of 29 slices shown]
[im 1/29]
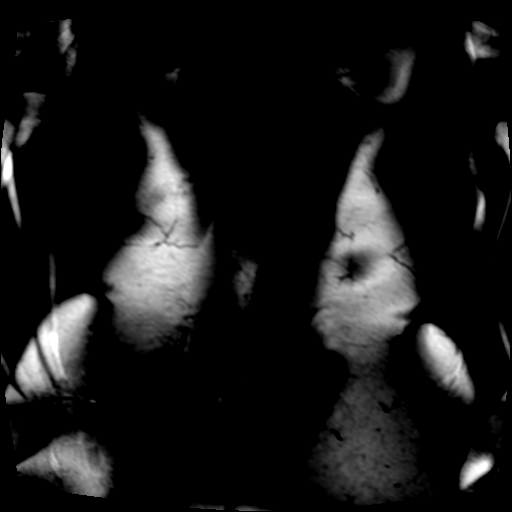

[Series 9: T1 dynamic · axial · non-contrast · 4.0mm · 0.78mm/px · z∈[-176,+204]mm · 3 of 96 slices shown]
[im 1/96]
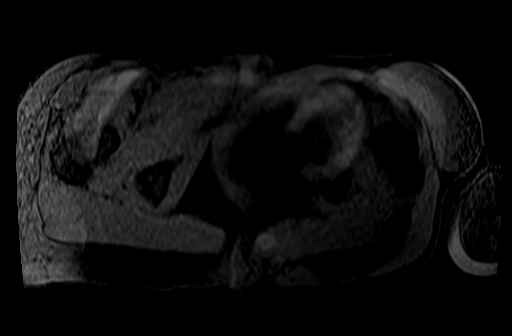
[im 48/96]
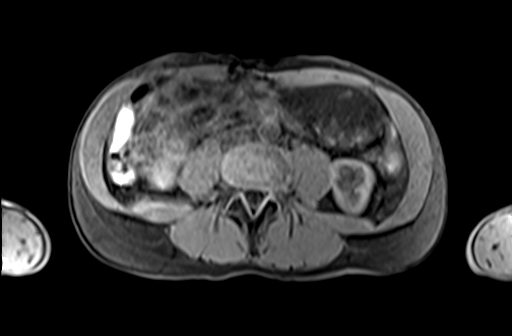
[im 96/96]
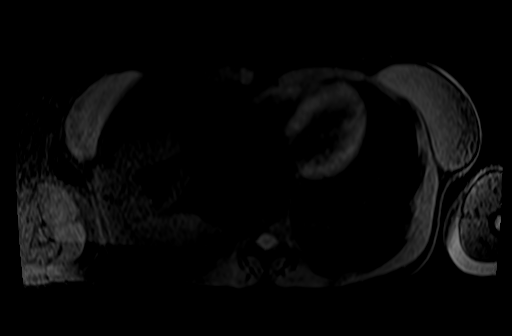

[Series 10: T1 dynamic post-contrast · axial · 4.0mm · 0.78mm/px · z∈[-176,+204]mm · 4 of 96 slices shown (1 of 5)]
[im 1/96]
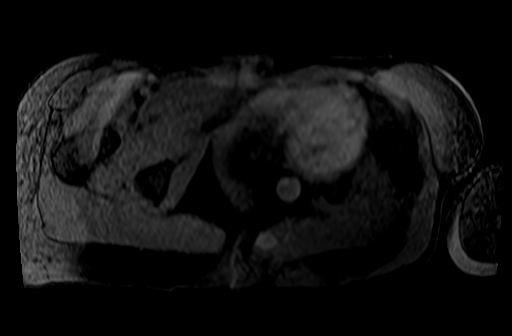
[im 32/96]
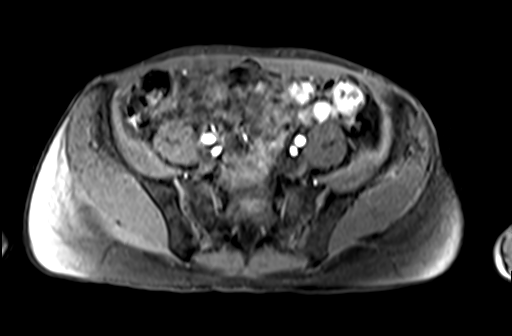
[im 64/96]
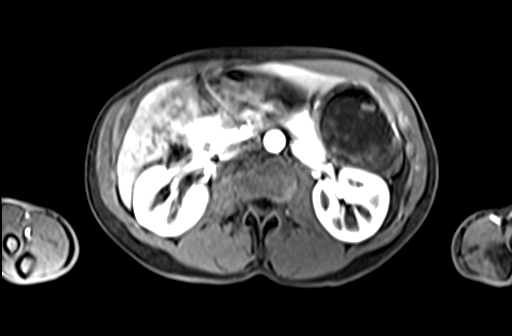
[im 96/96]
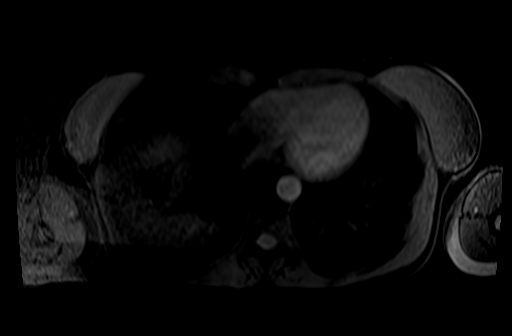

[Series 11: T1 dynamic post-contrast · axial · 4.0mm · 0.78mm/px · z∈[-176,+204]mm · 4 of 96 slices shown (2 of 5)]
[im 1/96]
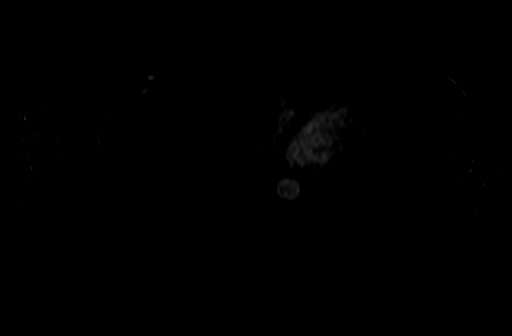
[im 32/96]
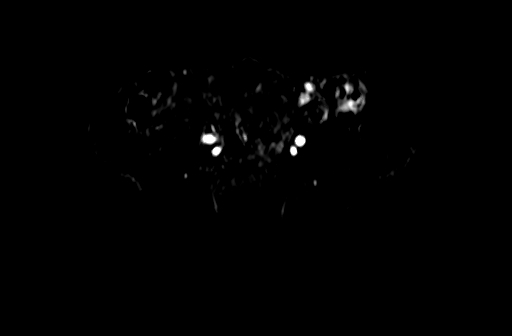
[im 64/96]
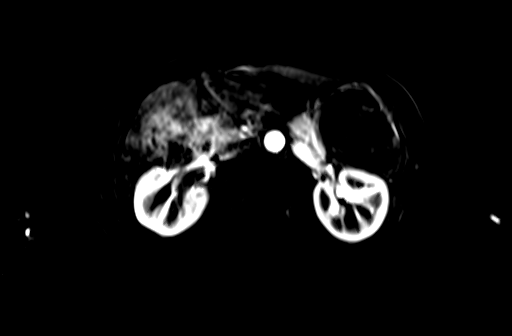
[im 96/96]
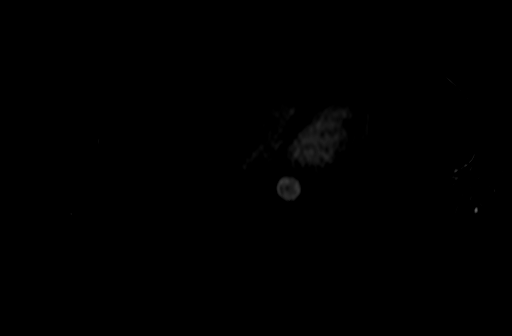

[Series 12: T1 dynamic post-contrast · axial · 4.0mm · 0.78mm/px · z∈[-176,+204]mm · 4 of 96 slices shown (3 of 5)]
[im 1/96]
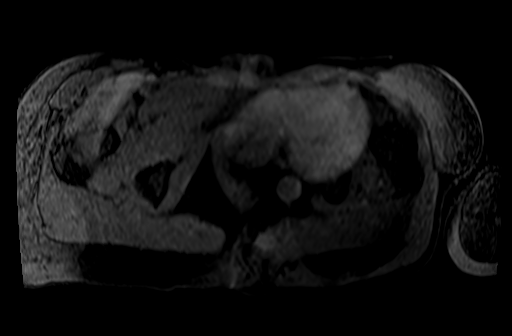
[im 32/96]
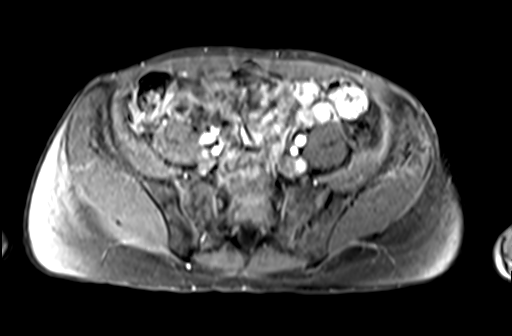
[im 64/96]
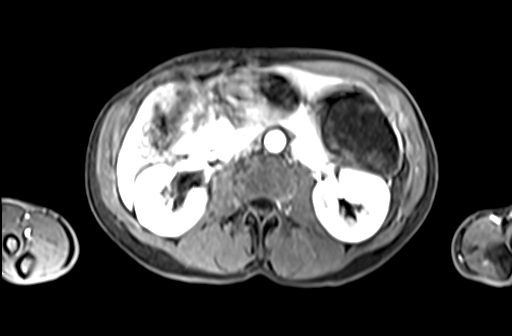
[im 96/96]
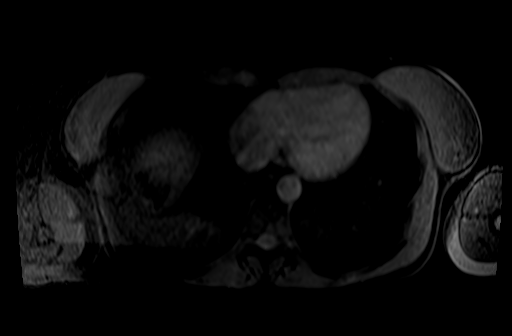

[Series 13: T1 dynamic post-contrast · axial · 4.0mm · 0.78mm/px · z∈[-176,+204]mm · 4 of 96 slices shown (4 of 5)]
[im 1/96]
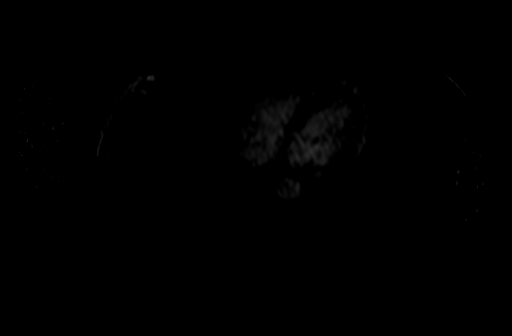
[im 32/96]
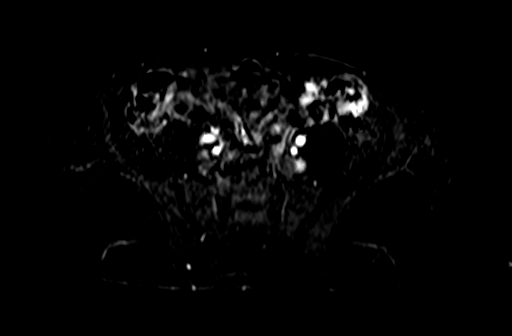
[im 64/96]
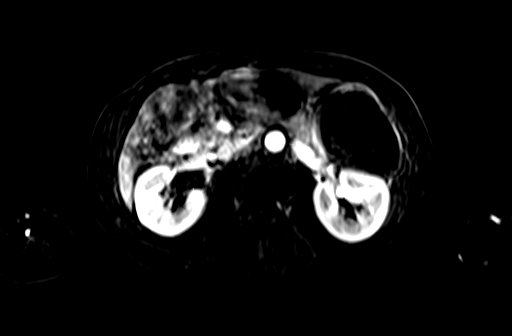
[im 96/96]
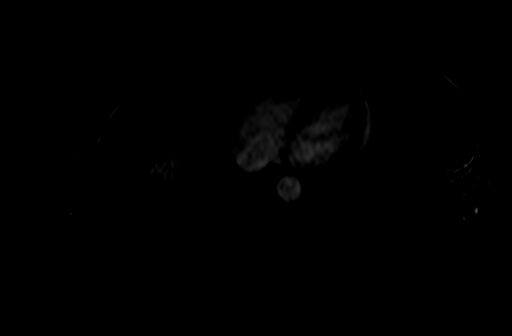

[Series 14: T1 dynamic post-contrast · axial · 4.0mm · 0.78mm/px · z∈[-176,+76]mm · 3 of 96 slices shown (5 of 5)]
[im 1/96]
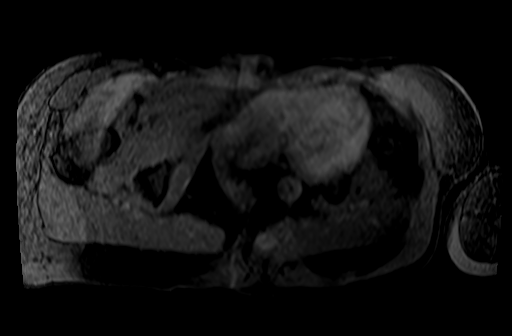
[im 32/96]
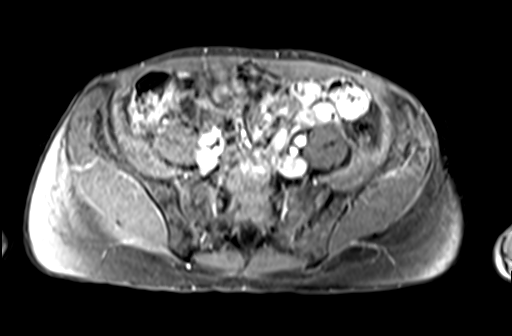
[im 64/96]
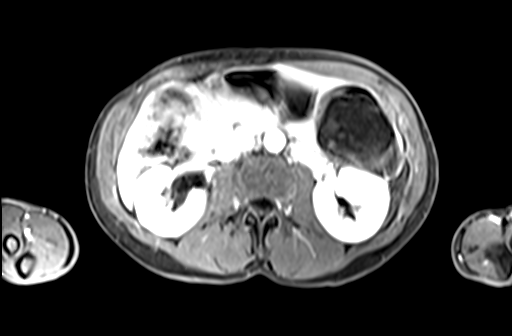

[30 of 48 positions shown; findings below may reference images not displayed]

FINDINGS: MR ABDOMEN FINDINGS

Lower chest: Lung bases are clear.

Hepatobiliary: Small nonenhancing cysts within the RIGHT hepatic
lobe on image 8, series 4. There is no intrahepatic biliary duct
dilatation. Common bile duct upper limits of normal at 5 mm (image
18, series 7). There is no internal filling defect. Patient status
post cholecystectomy.

Pancreas: Pancreas is normal. No ductal dilatation. No pancreatic
inflammation.

Spleen: Normal spleen

Adrenals/urinary tract: Adrenal glands are normal. There is no
lesion identified along the inferior margin of the RIGHT kidney as
described on comparison CT. Extrarenal pelvis on the RIGHT.

Stomach/Bowel: The stomach is moderately distended and contains a
moderate volume of food stuff. No obstructing lesion is evident at
the gastric outlet. The duodenum is normal. The jejunum has a normal
mucosal pattern. There is no evidence of small bowel dilatation. The
ileum likewise has normal mucosal pattern without evidence of
distention or stricture. The cecum extends into the pelvis. The
terminal ileum is not well identified. Appendix is not identified.
No inflammation of the terminal ileum. The ascending and transverse
colon are normal. The descending colon is normal. Distal sigmoid
anastomosis appears normal. Rectum appears normal.

Vascular/Lymphatic: Abdominal aorta is normal caliber. There is no
retroperitoneal or periportal lymphadenopathy. No pelvic
lymphadenopathy.

Reproductive: Several enhancing leiomyoma within the uterus.

Other: No free-fluid.

Musculoskeletal: No aggressive osseous lesion.

MR PELVIS FINDINGS

Normal bladder.  Leiomyomas uterus.
IMPRESSION: 1. Moderate volume of fluid and food stuff within the stomach
without evidence of gastric outlet obstruction.
2. No small bowel abnormality.
3. Cecum dips into the pelvis. Terminal ileum not well evaluated.
Appendix not well evaluated. No evidence inflammation.
4. No evidence of colonic obstruction. Distal anastomosis appears
normal.
5. No renal lesion identified.

## 2018-09-06 ENCOUNTER — Other Ambulatory Visit: Payer: Self-pay | Admitting: Family Medicine

## 2018-09-07 ENCOUNTER — Ambulatory Visit (INDEPENDENT_AMBULATORY_CARE_PROVIDER_SITE_OTHER): Payer: 59

## 2018-09-07 ENCOUNTER — Encounter: Payer: Self-pay | Admitting: Podiatry

## 2018-09-07 ENCOUNTER — Ambulatory Visit (INDEPENDENT_AMBULATORY_CARE_PROVIDER_SITE_OTHER): Payer: 59 | Admitting: Podiatry

## 2018-09-07 VITALS — BP 124/73 | HR 56

## 2018-09-07 DIAGNOSIS — M2011 Hallux valgus (acquired), right foot: Secondary | ICD-10-CM

## 2018-09-07 DIAGNOSIS — M2012 Hallux valgus (acquired), left foot: Secondary | ICD-10-CM | POA: Diagnosis not present

## 2018-09-07 DIAGNOSIS — M7752 Other enthesopathy of left foot: Secondary | ICD-10-CM

## 2018-09-07 DIAGNOSIS — M2010 Hallux valgus (acquired), unspecified foot: Secondary | ICD-10-CM

## 2018-09-07 DIAGNOSIS — M7751 Other enthesopathy of right foot: Secondary | ICD-10-CM

## 2018-09-07 MED ORDER — METHYLPREDNISOLONE 4 MG PO TBPK: ORAL_TABLET | ORAL | 0 refills | Status: DC

## 2018-09-07 MED ORDER — MELOXICAM 15 MG PO TABS: 15.0000 mg | ORAL_TABLET | Freq: Every day | ORAL | 1 refills | Status: AC

## 2018-09-09 NOTE — Progress Notes (Signed)
   Subjective: 57 year old female presenting today as a new patient with a chief complaint of painful bunions bilaterally that became symptomatic a few months ago. Wearing shoes increases the pain. She has not done anything for treatment. Patient is here for further evaluation and treatment.  Past Medical History:  Diagnosis Date  . Asthma       Objective: Physical Exam General: The patient is alert and oriented x3 in no acute distress.  Dermatology: Skin is cool, dry and supple bilateral lower extremities. Negative for open lesions or macerations.  Vascular: Palpable pedal pulses bilaterally. No edema or erythema noted. Capillary refill within normal limits.  Neurological: Epicritic and protective threshold grossly intact bilaterally.   Musculoskeletal Exam: Clinical evidence of bunion deformity noted to the respective foot. There is a moderate pain on palpation range of motion of the first MPJ. Lateral deviation of the hallux noted consistent with hallux abductovalgus.  Radiographic Exam: Increased intermetatarsal angle greater than 15 with a hallux abductus angle greater than 30 noted on AP view. Moderate degenerative changes noted within the first MPJ.  Assessment: 1. HAV w/ bunion deformity bilateral 2. 1st MPJ capsulitis bilateral     Plan of Care:  1. Patient was evaluated. X-Rays reviewed. 2. Today we discussed the conservative versus surgical management of bunion deformity. 3. She would like to try conservative treatment at this time.  4. Declined injections.  5. Prescription for Medrol Dose Pak provided to patient.  6. Prescription for Meloxicam provided to patient.  7. Recommended stiff sole shoes.  8. Return to clinic in 6 weeks.   Very active. Works out at [employer] and [employer].    Edrick Kins, DPM [facility]  Dr. Edrick Kins, [address]                                        [place], [place] [address]                 Office [phone]  Fax [phone]

## 2018-09-15 ENCOUNTER — Ambulatory Visit: Payer: 59 | Admitting: Podiatry

## 2018-10-19 ENCOUNTER — Encounter: Payer: 59 | Admitting: Podiatry

## 2018-10-30 NOTE — Progress Notes (Signed)
This encounter was created in error - please disregard.

## 2018-11-26 ENCOUNTER — Other Ambulatory Visit: Payer: Self-pay | Admitting: Family Medicine

## 2018-11-26 DIAGNOSIS — Z1231 Encounter for screening mammogram for malignant neoplasm of breast: Secondary | ICD-10-CM

## 2018-12-13 ENCOUNTER — Ambulatory Visit
Admission: RE | Admit: 2018-12-13 | Discharge: 2018-12-13 | Disposition: A | Discharge status: Home / Self Care | Payer: 59 | Source: Ambulatory Visit | Attending: Family Medicine | Admitting: Family Medicine

## 2018-12-13 DIAGNOSIS — Z1231 Encounter for screening mammogram for malignant neoplasm of breast: Secondary | ICD-10-CM

## 2019-08-23 ENCOUNTER — Other Ambulatory Visit: Payer: Self-pay | Admitting: Certified Nurse Midwife

## 2019-08-23 DIAGNOSIS — N644 Mastodynia: Secondary | ICD-10-CM

## 2019-08-23 DIAGNOSIS — Z9882 Breast implant status: Secondary | ICD-10-CM

## 2019-08-24 ENCOUNTER — Other Ambulatory Visit: Payer: Self-pay | Admitting: Certified Nurse Midwife

## 2019-08-24 DIAGNOSIS — N644 Mastodynia: Secondary | ICD-10-CM

## 2019-08-24 DIAGNOSIS — Z9882 Breast implant status: Secondary | ICD-10-CM

## 2019-09-02 ENCOUNTER — Ambulatory Visit
Admission: RE | Admit: 2019-09-02 | Discharge: 2019-09-02 | Disposition: A | Discharge status: Home / Self Care | Payer: 59 | Source: Ambulatory Visit | Attending: Certified Nurse Midwife | Admitting: Certified Nurse Midwife

## 2019-09-02 DIAGNOSIS — N644 Mastodynia: Secondary | ICD-10-CM | POA: Diagnosis present

## 2019-09-02 DIAGNOSIS — Z9882 Breast implant status: Secondary | ICD-10-CM

## 2020-01-25 ENCOUNTER — Other Ambulatory Visit: Payer: Self-pay | Admitting: Otolaryngology

## 2020-01-25 DIAGNOSIS — R1310 Dysphagia, unspecified: Secondary | ICD-10-CM

## 2020-01-25 DIAGNOSIS — R220 Localized swelling, mass and lump, head: Secondary | ICD-10-CM

## 2020-02-02 ENCOUNTER — Other Ambulatory Visit: Payer: Self-pay | Admitting: Otolaryngology

## 2020-02-02 DIAGNOSIS — R1310 Dysphagia, unspecified: Secondary | ICD-10-CM

## 2020-02-02 DIAGNOSIS — K149 Disease of tongue, unspecified: Secondary | ICD-10-CM

## 2020-02-09 ENCOUNTER — Other Ambulatory Visit: Payer: 59

## 2020-02-16 ENCOUNTER — Other Ambulatory Visit: Payer: Self-pay | Admitting: Otolaryngology

## 2020-02-16 DIAGNOSIS — K149 Disease of tongue, unspecified: Secondary | ICD-10-CM

## 2020-02-16 DIAGNOSIS — R1310 Dysphagia, unspecified: Secondary | ICD-10-CM

## 2020-02-21 ENCOUNTER — Other Ambulatory Visit: Payer: 59

## 2020-02-24 ENCOUNTER — Other Ambulatory Visit: Payer: Self-pay | Admitting: Dermatology

## 2020-03-06 ENCOUNTER — Ambulatory Visit
Admission: RE | Admit: 2020-03-06 | Discharge: 2020-03-06 | Disposition: A | Discharge status: Home / Self Care | Payer: 59 | Source: Ambulatory Visit | Attending: Otolaryngology | Admitting: Otolaryngology

## 2020-03-06 DIAGNOSIS — R1310 Dysphagia, unspecified: Secondary | ICD-10-CM

## 2020-03-06 DIAGNOSIS — K149 Disease of tongue, unspecified: Secondary | ICD-10-CM

## 2020-03-06 MED ORDER — IOPAMIDOL (ISOVUE-300) INJECTION 61%
75.0000 mL | Freq: Once | INTRAVENOUS | Status: AC | PRN
  Administered 2020-03-06: 75 mL via INTRAVENOUS

## 2020-04-09 ENCOUNTER — Other Ambulatory Visit: Payer: Self-pay | Admitting: Nurse Practitioner

## 2020-04-09 DIAGNOSIS — Z8781 Personal history of (healed) traumatic fracture: Secondary | ICD-10-CM

## 2020-05-14 ENCOUNTER — Other Ambulatory Visit: Payer: 59

## 2020-09-18 ENCOUNTER — Other Ambulatory Visit: Payer: Self-pay

## 2020-09-18 ENCOUNTER — Encounter: Payer: Self-pay | Admitting: Dermatology

## 2020-09-18 ENCOUNTER — Ambulatory Visit (INDEPENDENT_AMBULATORY_CARE_PROVIDER_SITE_OTHER): Payer: 59 | Admitting: Dermatology

## 2020-09-18 DIAGNOSIS — L219 Seborrheic dermatitis, unspecified: Secondary | ICD-10-CM | POA: Diagnosis not present

## 2020-09-18 DIAGNOSIS — Z1283 Encounter for screening for malignant neoplasm of skin: Secondary | ICD-10-CM

## 2020-09-18 DIAGNOSIS — L578 Other skin changes due to chronic exposure to nonionizing radiation: Secondary | ICD-10-CM

## 2020-09-18 DIAGNOSIS — L821 Other seborrheic keratosis: Secondary | ICD-10-CM

## 2020-09-18 DIAGNOSIS — L570 Actinic keratosis: Secondary | ICD-10-CM

## 2020-09-18 DIAGNOSIS — Z85828 Personal history of other malignant neoplasm of skin: Secondary | ICD-10-CM

## 2020-09-18 DIAGNOSIS — D225 Melanocytic nevi of trunk: Secondary | ICD-10-CM

## 2020-09-18 DIAGNOSIS — D1800 Hemangioma unspecified site: Secondary | ICD-10-CM

## 2020-09-18 DIAGNOSIS — D229 Melanocytic nevi, unspecified: Secondary | ICD-10-CM

## 2020-09-18 DIAGNOSIS — D2271 Melanocytic nevi of right lower limb, including hip: Secondary | ICD-10-CM | POA: Diagnosis not present

## 2020-09-18 DIAGNOSIS — L814 Other melanin hyperpigmentation: Secondary | ICD-10-CM

## 2020-09-18 MED ORDER — MOMETASONE FUROATE 0.1 % EX SOLN: CUTANEOUS | 2 refills | Status: DC

## 2020-09-18 NOTE — Patient Instructions (Addendum)
Cryotherapy Aftercare ° °• Wash gently with soap and water everyday.   °• Apply Vaseline and Band-Aid daily until healed. °Seborrheic Keratosis ° °What causes seborrheic keratoses? °Seborrheic keratoses are harmless, common skin growths that first appear during adult life.  As time goes by, more growths appear.  Some people may develop a large number of them.  Seborrheic keratoses appear on both covered and uncovered body parts.  They are not caused by sunlight.  The tendency to develop seborrheic keratoses can be inherited.  They vary in color from skin-colored to gray, brown, or even black.  They can be either smooth or have a rough, warty surface.   °Seborrheic keratoses are superficial and look as if they were stuck on the skin.  Under the microscope this type of keratosis looks like layers upon layers of skin.  That is why at times the top layer may seem to fall off, but the rest of the growth remains and re-grows.   ° °Treatment °Seborrheic keratoses do not need to be treated, but can easily be removed in the office.  Seborrheic keratoses often cause symptoms when they rub on clothing or jewelry.  Lesions can be in the way of shaving.  If they become inflamed, they can cause itching, soreness, or burning.  Removal of a seborrheic keratosis can be accomplished by freezing, burning, or surgery. °If any spot bleeds, scabs, or grows rapidly, please return to have it checked, as these can be an indication of a skin cancer. °Melanoma ABCDEs ° °Melanoma is the most dangerous type of skin cancer, and is the leading cause of death from skin disease.  You are more likely to develop melanoma if you: °Have light-colored skin, light-colored eyes, or red or blond hair °Spend a lot of time in the sun °Tan regularly, either outdoors or in a tanning bed °Have had blistering sunburns, especially during childhood °Have a close family member who has had a melanoma °Have atypical moles or large birthmarks ° °Early detection of  melanoma is key since treatment is typically straightforward and cure rates are extremely high if we catch it early.  ° °The first sign of melanoma is often a change in a mole or a new dark spot.  The ABCDE system is a way of remembering the signs of melanoma. ° °A for asymmetry:  The two halves do not match. °B for border:  The edges of the growth are irregular. °C for color:  A mixture of colors are present instead of an even brown color. °D for diameter:  Melanomas are usually (but not always) greater than 6mm - the size of a pencil eraser. °E for evolution:  The spot keeps changing in size, shape, and color. ° °Please check your skin once per month between visits. You can use a small mirror in front and a large mirror behind you to keep an eye on the back side or your body.  ° °If you see any new or changing lesions before your next follow-up, please call to schedule a visit. ° °Please continue daily skin protection including broad spectrum sunscreen SPF 30+ to sun-exposed areas, reapplying every 2 hours as needed when you're outdoors.  ° °

## 2020-09-18 NOTE — Progress Notes (Signed)
Follow-Up Visit   Subjective  Kristy Gibson is a 59 y.o. female who presents for the following: Annual Exam (Hx of BCC of the posterior neck). She has a few spots she would like checked on her chest and back and leg. The growth on her L back is rough and crusted, but not bothersome.    The following portions of the chart were reviewed this encounter and updated as appropriate:      Review of Systems:  No other skin or systemic complaints except as noted in HPI or Assessment and Plan.  Objective  Well appearing patient in no apparent distress; mood and affect are within normal limits.  A full examination was performed including scalp, head, eyes, ears, nose, lips, neck, chest, axillae, abdomen, back, buttocks, bilateral upper extremities, bilateral lower extremities, hands, feet, fingers, toes, fingernails, and toenails. All findings within normal limits unless otherwise noted below.  Objective  Right Lower Back (vs Lentigo): 4.59mm triangle-shaped brown macule   Right Lower Pretibia: 4.14mm brown macule, slight darker edge  Images      Objective  Right Posterior Shoulder x 1: Pink scaly macule.  Objective  Ears: Mild erythema, gets itching off and on   Assessment & Plan   Skin cancer screening performed today.  Actinic Damage - chronic, secondary to cumulative UV radiation exposure/sun exposure over time - diffuse mild eythema with underlying dyspigmentation and lentigos - Recommend daily broad spectrum sunscreen SPF 30+ to sun-exposed areas, reapply every 2 hours as needed.  - Call for new or changing lesions.  History of Basal Cell Carcinoma of the Skin - No evidence of recurrence today of the posterior neck - Recommend regular full body skin exams - Recommend daily broad spectrum sunscreen SPF 30+ to sun-exposed areas, reapply every 2 hours as needed.  - Call if any new or changing lesions are noted between office visits  Lentigines - Scattered tan macules-  numerous on arms, legs, back - Discussed due to sun exposure - Benign, observe - Call for any changes  Seborrheic Keratoses - Stuck-on, waxy, tan-brown papules and plaques including left upper back, right ant thigh, right knee - Discussed benign etiology and prognosis. Reassured benign age-related growth.  Recommend observation.  Discussed cryotherapy if spot(s) become irritated or inflamed. - Observe - Call for any changes  Hemangiomas - Red papules- chest - Discussed benign nature - Observe - Call for any changes    Nevus (2) Right Lower Pretibia; Right Lower Back (vs Lentigo)  Nevus vs Lentigo  Benign-appearing.  Stable from last visit. Observation.  Call clinic for new or changing moles.  Recommend daily use of broad spectrum spf 30+ sunscreen to sun-exposed areas.    AK (actinic keratosis) Right Posterior Shoulder x 1  Destruction of lesion - Right Posterior Shoulder x 1  Destruction method: cryotherapy   Informed consent: discussed and consent obtained   Lesion destroyed using liquid nitrogen: Yes   Region frozen until ice ball extended beyond lesion: Yes   Outcome: patient tolerated procedure well with no complications   Post-procedure details: wound care instructions given    Seborrheic dermatitis Ears  Chronic condition, continued itching. Mometasone lotion Apply qd/bid ears prn itch/scale dsp 66mL 2Rf.  Topical steroids (such as triamcinolone, fluocinolone, fluocinonide, mometasone, clobetasol, halobetasol, betamethasone, hydrocortisone) can cause thinning and lightening of the skin if they are used for too long in the same area. Your physician has selected the right strength medicine for your problem and area affected on the  body. Please use your medication only as directed by your physician to prevent side effects.    mometasone (ELOCON) 0.1 % lotion - Ears  Return in about 1 year (around 09/18/2021) for TBSE.  IJamesetta Orleans, CMA, am acting as scribe  for Brendolyn Patty, MD .  Documentation: I have reviewed the above documentation for accuracy and completeness, and I agree with the above.  Brendolyn Patty MD

## 2021-05-27 ENCOUNTER — Other Ambulatory Visit: Payer: Self-pay | Admitting: Family Medicine

## 2021-05-27 DIAGNOSIS — Z1231 Encounter for screening mammogram for malignant neoplasm of breast: Secondary | ICD-10-CM

## 2021-06-03 ENCOUNTER — Ambulatory Visit
Admission: RE | Admit: 2021-06-03 | Discharge: 2021-06-03 | Disposition: A | Discharge status: Home / Self Care | Payer: 59 | Source: Ambulatory Visit | Attending: Family Medicine | Admitting: Family Medicine

## 2021-06-03 ENCOUNTER — Other Ambulatory Visit: Payer: Self-pay

## 2021-06-03 DIAGNOSIS — Z1231 Encounter for screening mammogram for malignant neoplasm of breast: Secondary | ICD-10-CM | POA: Diagnosis not present

## 2021-06-08 ENCOUNTER — Ambulatory Visit
Admission: EM | Admit: 2021-06-08 | Discharge: 2021-06-08 | Disposition: A | Discharge status: Home / Self Care | Payer: 59

## 2021-06-08 ENCOUNTER — Encounter: Payer: Self-pay | Admitting: Emergency Medicine

## 2021-06-08 ENCOUNTER — Other Ambulatory Visit: Payer: Self-pay

## 2021-06-08 DIAGNOSIS — H1011 Acute atopic conjunctivitis, right eye: Secondary | ICD-10-CM

## 2021-06-08 DIAGNOSIS — H00021 Hordeolum internum right upper eyelid: Secondary | ICD-10-CM | POA: Diagnosis not present

## 2021-06-08 MED ORDER — OLOPATADINE HCL 0.1 % OP SOLN: 1.0000 [drp] | Freq: Two times a day (BID) | OPHTHALMIC | 0 refills | Status: AC

## 2021-06-08 NOTE — Discharge Instructions (Addendum)
Use Pataday drops as directed.  A stye is an infection in the root of an eyelash. The infection causes a tender red lump on the edge of the eyelid. The infection can spread until the whole eyelid becomes red and inflamed. Styes usually break open, and a tiny amount of pus drains. They usually clear up on their own in about a week, but they sometimes need treatment with antibiotics.  To treat an infection of the eyelid: Do not rub your eyes. Do not squeeze or try to open a stye or chalazion. To help a stye or chalazion heal faster: Put a warm, moist compress on your eye for 5 to 10 minutes, 3 to 6 times a day. Heat often brings a stye to a point where it drains on its own. Keep in mind that warm compresses will often increase swelling a little at first. Do not use hot water or heat a wet cloth in a microwave oven. The compress may get too hot and can burn the eyelid. Always wash your hands before and after you use a compress or touch your eyes. Do not wear eye makeup or contact lenses until the stye or chalazion heals. Do not share towels, pillows, or washcloths while you have a stye.  Return to clinic or go to the ER if you have pain in the eye, change or loss in vision, worsening redness or swelling.

## 2021-06-08 NOTE — ED Triage Notes (Signed)
Patient c/o eye irritation that started 2 days ago.   Patient endorses onset of symptoms began after " attempting to crazy glue a part of my glasses that's close to my eyes"  Patient denies any vision changes.   Patient endorses eye redness and "it feels like sandpaper is in my eye".   Patient has used lubricant eye drops w/ "a little " relief.

## 2021-06-08 NOTE — ED Provider Notes (Signed)
Chief Complaint   Chief Complaint  Patient presents with   Eye Pain     Subjective  Kristy Gibson is a 60 y.o. female who presents with right eye redness which started 2 days ago.  Patient states that she used crazy glue to repair a pair of sunglasses and states that her symptoms began after that.  Patient reports that she has used some eye lubricant with very little relief.  She reports a sandpaper sensation to the right eye with redness.  She denies any visual changes, trauma, falls, foreign body.  She reports no known sick contacts or drainage to the right eye. They do not wear contacts.   History obtained from patient.  Patient's past medical, social, surgical, and medication history were reviewed by me and updated in Epic.    Review of Systems   See HPI.    Objective   Vitals:   06/08/21 1503  BP: 120/74  Pulse: (!) 57  Resp: 14  Temp: 98 F (36.7 C)  SpO2: 97%            General:  Alert, cooperative, appears stated age. No acute distress  Eyes:  Bilateral: PERRLA.  EOMI. Right: Mildly erythematous sclera noted to inner aspect with stye noted to inner aspect of upper right eyelid internally.  Very mildly injected conjunctiva noted. Left: WNL  Vision: Left: 20/100, right: 20/100, bilateral: 20/20.  Fluorescein:  None needed.      Assessment & Plan  1. Hordeolum internum of right upper eyelid  2. Acute allergic conjunctivitis of right eye  Meds ordered this encounter  Medications   olopatadine (PATADAY) 0.1 % ophthalmic solution    Sig: Place 1 drop into the right eye 2 (two) times daily for 10 days.    Dispense:  5 mL    Refill:  0    Order Specific Question:   Supervising Provider    Answer:   Chase Picket [id number] y.o. female presents with right eye redness which started 2 days ago.  Patient states that she used crazy glue to repair a pair of sunglasses and states that her symptoms began after that.  Patient reports that she has used some eye  lubricant with very little relief.  She reports a sandpaper sensation to the right eye with redness.  She denies any visual changes, trauma, falls, foreign body.  She reports no known sick contacts or drainage to the right eye. They do not wear contacts.  Chart review completed.  Given symptoms along with assessment findings, likely allergic conjunctivitis of right eye with a hordeolum internum of the right upper eyelid.  Rx'd Pataday drops to the patient's preferred pharmacy and advised about home treatment and care to include warm compresses.  Advised that styes will typically have some purulent drainage, but advised that these will typically not need antibiotics.  Advised of return precautions for worsening of symptoms as outlined in her AVS to include significant changes in vision, worsening pain or swelling.  Patient verbalized understanding and agreed with plan.  Patient stable upon discharge.  Return as needed.  Plan:   Discharge Instructions      Use Pataday drops as directed.  A stye is an infection in the root of an eyelash. The infection causes a tender red lump on the edge of the eyelid. The infection can spread until the whole eyelid becomes red and inflamed. Styes usually break open, and a tiny amount of pus drains. They  usually clear up on their own in about a week, but they sometimes need treatment with antibiotics.  To treat an infection of the eyelid: Do not rub your eyes. Do not squeeze or try to open a stye or chalazion. To help a stye or chalazion heal faster: Put a warm, moist compress on your eye for 5 to 10 minutes, 3 to 6 times a day. Heat often brings a stye to a point where it drains on its own. Keep in mind that warm compresses will often increase swelling a little at first. Do not use hot water or heat a wet cloth in a microwave oven. The compress may get too hot and can burn the eyelid. Always wash your hands before and after you use a compress or touch your eyes. Do  not wear eye makeup or contact lenses until the stye or chalazion heals. Do not share towels, pillows, or washcloths while you have a stye.  Return to clinic or go to the ER if you have pain in the eye, change or loss in vision, worsening redness or swelling.          Serafina Royals, [place] 06/08/21 1517

## 2021-07-29 ENCOUNTER — Ambulatory Visit (INDEPENDENT_AMBULATORY_CARE_PROVIDER_SITE_OTHER): Payer: 59 | Admitting: Dermatology

## 2021-07-29 ENCOUNTER — Other Ambulatory Visit: Payer: Self-pay

## 2021-07-29 DIAGNOSIS — L578 Other skin changes due to chronic exposure to nonionizing radiation: Secondary | ICD-10-CM | POA: Diagnosis not present

## 2021-07-29 DIAGNOSIS — L719 Rosacea, unspecified: Secondary | ICD-10-CM

## 2021-07-29 DIAGNOSIS — R238 Other skin changes: Secondary | ICD-10-CM

## 2021-07-29 DIAGNOSIS — L570 Actinic keratosis: Secondary | ICD-10-CM

## 2021-07-29 MED ORDER — IVERMECTIN 1 % EX CREA: TOPICAL_CREAM | CUTANEOUS | 5 refills | Status: DC

## 2021-07-29 NOTE — Progress Notes (Signed)
Follow-Up Visit   Subjective  Kristy Gibson is a 60 y.o. female who presents for the following: Spots (Patient presents to have several spots checked. She has a growth on her left temple that she noticed 6 months ago, sensitive to touch. She has a red spot on her right nasal tip x 2 months, scabs, peels, and is growing. She also has a dark spot on her left lower lip, present for years.). No changes   The following portions of the chart were reviewed this encounter and updated as appropriate:       Review of Systems:  No other skin or systemic complaints except as noted in HPI or Assessment and Plan.  Objective  Well appearing patient in no apparent distress; mood and affect are within normal limits.  A focused examination was performed including face. Relevant physical exam findings are noted in the Assessment and Plan.  Left Temple Erythematous thin papule with gritty scale.   nose 2 mm blanching pink crusted macule of the right nasal ala; erythema of the nose and malar cheeks.  L lower lip 2.57mm violaceous soft papule, blanches   Assessment & Plan  Actinic Damage - chronic, secondary to cumulative UV radiation exposure/sun exposure over time - diffuse scaly erythematous macules with underlying dyspigmentation - Recommend daily broad spectrum sunscreen SPF 30+ to sun-exposed areas, reapply every 2 hours as needed.  - Recommend staying in the shade or wearing long sleeves, sun glasses (UVA+UVB protection) and wide brim hats (4-inch brim around the entire circumference of the hat). - Call for new or changing lesions.  AK (actinic keratosis) Left Temple  vs ISK Recheck on f/up  Actinic keratoses are precancerous spots that appear secondary to cumulative UV radiation exposure/sun exposure over time. They are chronic with expected duration over 1 year. A portion of actinic keratoses will progress to squamous cell carcinoma of the skin. It is not possible to reliably predict  which spots will progress to skin cancer and so treatment is recommended to prevent development of skin cancer.  Recommend daily broad spectrum sunscreen SPF 30+ to sun-exposed areas, reapply every 2 hours as needed.  Recommend staying in the shade or wearing long sleeves, sun glasses (UVA+UVB protection) and wide brim hats (4-inch brim around the entire circumference of the hat). Call for new or changing lesions.  Destruction of lesion - Left Temple  Destruction method: cryotherapy   Informed consent: discussed and consent obtained   Lesion destroyed using liquid nitrogen: Yes   Region frozen until ice ball extended beyond lesion: Yes   Outcome: patient tolerated procedure well with no complications   Post-procedure details: wound care instructions given   Additional details:  Prior to procedure, discussed risks of blister formation, small wound, skin dyspigmentation, or rare scar following cryotherapy. Recommend Vaseline ointment to treated areas while healing.   Rosacea nose  Irritated telangiectasia R nasal ala, doubt AK/BCC Recheck on f/up  Rosacea is a chronic progressive skin condition usually affecting the face of adults, causing redness and/or acne bumps. It is treatable but not curable. It sometimes affects the eyes (ocular rosacea) as well. It may respond to topical and/or systemic medication and can flare with stress, sun exposure, alcohol, exercise and some foods.  Daily application of broad spectrum spf 30+ sunscreen to face is recommended to reduce flares.  Soolantra cream apply to nose/R nasal tip QHS - pt has.   Ivermectin (SOOLANTRA) 1 % CREA - nose Apply to cheeks and nose every night  for rosacea.  Venous lake L lower lip  Benign. Observe.   Return in about 2 months (around 09/28/2021) for TBSE, recheck nose.  IJamesetta Orleans, CMA, am acting as scribe for Brendolyn Patty, MD . Documentation: I have reviewed the above documentation for accuracy and completeness,  and I agree with the above.  Brendolyn Patty MD

## 2021-07-29 NOTE — Patient Instructions (Addendum)

## 2021-10-01 ENCOUNTER — Other Ambulatory Visit: Payer: Self-pay | Admitting: Dermatology

## 2021-10-01 ENCOUNTER — Other Ambulatory Visit: Payer: Self-pay

## 2021-10-01 ENCOUNTER — Ambulatory Visit (INDEPENDENT_AMBULATORY_CARE_PROVIDER_SITE_OTHER): Payer: 59 | Admitting: Dermatology

## 2021-10-01 ENCOUNTER — Encounter: Payer: 59 | Admitting: Dermatology

## 2021-10-01 DIAGNOSIS — Z85828 Personal history of other malignant neoplasm of skin: Secondary | ICD-10-CM

## 2021-10-01 DIAGNOSIS — D229 Melanocytic nevi, unspecified: Secondary | ICD-10-CM

## 2021-10-01 DIAGNOSIS — D1801 Hemangioma of skin and subcutaneous tissue: Secondary | ICD-10-CM

## 2021-10-01 DIAGNOSIS — D369 Benign neoplasm, unspecified site: Secondary | ICD-10-CM

## 2021-10-01 DIAGNOSIS — L570 Actinic keratosis: Secondary | ICD-10-CM | POA: Diagnosis not present

## 2021-10-01 DIAGNOSIS — Z872 Personal history of diseases of the skin and subcutaneous tissue: Secondary | ICD-10-CM

## 2021-10-01 DIAGNOSIS — D225 Melanocytic nevi of trunk: Secondary | ICD-10-CM

## 2021-10-01 DIAGNOSIS — Z1283 Encounter for screening for malignant neoplasm of skin: Secondary | ICD-10-CM | POA: Diagnosis not present

## 2021-10-01 DIAGNOSIS — L219 Seborrheic dermatitis, unspecified: Secondary | ICD-10-CM

## 2021-10-01 DIAGNOSIS — L918 Other hypertrophic disorders of the skin: Secondary | ICD-10-CM

## 2021-10-01 DIAGNOSIS — L814 Other melanin hyperpigmentation: Secondary | ICD-10-CM

## 2021-10-01 DIAGNOSIS — L821 Other seborrheic keratosis: Secondary | ICD-10-CM

## 2021-10-01 DIAGNOSIS — D3611 Benign neoplasm of peripheral nerves and autonomic nervous system of face, head, and neck: Secondary | ICD-10-CM | POA: Diagnosis not present

## 2021-10-01 DIAGNOSIS — L578 Other skin changes due to chronic exposure to nonionizing radiation: Secondary | ICD-10-CM

## 2021-10-01 DIAGNOSIS — D485 Neoplasm of uncertain behavior of skin: Secondary | ICD-10-CM | POA: Diagnosis not present

## 2021-10-01 DIAGNOSIS — L719 Rosacea, unspecified: Secondary | ICD-10-CM

## 2021-10-01 MED ORDER — MOMETASONE FUROATE 0.1 % EX SOLN: CUTANEOUS | 2 refills | Status: DC

## 2021-10-01 MED ORDER — DOXYCYCLINE HYCLATE 100 MG PO CAPS: 100.0000 mg | ORAL_CAPSULE | Freq: Every day | ORAL | 3 refills | Status: DC

## 2021-10-01 NOTE — Progress Notes (Signed)
Follow-Up Visit   Subjective  Kristy Gibson is a 60 y.o. female who presents for the following: Annual Exam.  Patient here for TBSE. She has a new spot on her left neck and upper forehead. AK vs ISK of the left temple treated at last visit clear per patient. She also had an irritated telangiectasia (doubt BCC) of the right nasal ala to recheck. Patient states area went away, but came back. She uses Soolantra Cream daily and doxycycline 100mg  prn flares. She has a history of BCC of the posterior neck.   The following portions of the chart were reviewed this encounter and updated as appropriate:       Review of Systems:  No other skin or systemic complaints except as noted in HPI or Assessment and Plan.  Objective  Well appearing patient in no apparent distress; mood and affect are within normal limits.  A full examination was performed including scalp, head, eyes, ears, nose, lips, neck, chest, axillae, abdomen, back, buttocks, bilateral upper extremities, bilateral lower extremities, hands, feet, fingers, toes, fingernails, and toenails. All findings within normal limits unless otherwise noted below.  R lower back 4.22mm triangle-shaped brown macule          Left Temple Clear  Right nasal ala Blanching 1.66mm pink crusted macule.  Pt states will clear up and then come back.     forehead x 6 (6) Pink scaly macules.  face Mild erythema of the nose and malar cheeks.   sternal notch 7.0 x 3.95mm brown macule, lighter edge       ears Mild erythema.  Right lower pretibia 4.0 mm brown macule, slight darker edge      Assessment & Plan  Skin cancer screening performed today.  Actinic Damage - chronic, secondary to cumulative UV radiation exposure/sun exposure over time - diffuse scaly erythematous macules with underlying dyspigmentation - Recommend daily broad spectrum sunscreen SPF 30+ to sun-exposed areas, reapply every 2 hours as needed.  - Recommend  staying in the shade or wearing long sleeves, sun glasses (UVA+UVB protection) and wide brim hats (4-inch brim around the entire circumference of the hat). - Call for new or changing lesions.  Lentigines - Scattered tan macules - Due to sun exposure - Benign-appering, observe - Recommend daily broad spectrum sunscreen SPF 30+ to sun-exposed areas, reapply every 2 hours as needed. - Call for any changes  Seborrheic Keratoses - Stuck-on, waxy, tan-brown papules and/or plaques  - Benign-appearing - Discussed benign etiology and prognosis. - Observe - Call for any changes  Melanocytic Nevi - Tan-brown and/or pink-flesh-colored symmetric macules and papules - Benign appearing on exam today - Observation - Call clinic for new or changing moles - Recommend daily use of broad spectrum spf 30+ sunscreen to sun-exposed areas.   Acrochordons (Skin Tags) vs SK - Tiny flesh papule of the left neck - Benign appearing.  - Observe. - If desired, they can be removed with an in office procedure that is not covered by insurance. - Please call the clinic if you notice any new or changing lesions.  Nevus R lower back  Vrs Lentigo Benign-appearing.  Stable. Observation.  Call clinic for new or changing moles.  Recommend daily use of broad spectrum spf 30+ sunscreen to sun-exposed areas.   History of actinic keratosis Left Temple  Vs ISK   Clear. Observe for recurrence. Call clinic for new or changing lesions.  Recommend regular skin exams, daily broad-spectrum spf 30+ sunscreen use, and photoprotection.  Angiofibroma Right nasal ala  Doubt BCC. Discussed biopsy to confirm, patient defers today and prefers to observe for changes. Photo taken today.   Continue Soolantra cream qhs  AK (actinic keratosis) (6) forehead x 6  Actinic keratoses are precancerous spots that appear secondary to cumulative UV radiation exposure/sun exposure over time. They are chronic with expected duration  over 1 year. A portion of actinic keratoses will progress to squamous cell carcinoma of the skin. It is not possible to reliably predict which spots will progress to skin cancer and so treatment is recommended to prevent development of skin cancer.  Recommend daily broad spectrum sunscreen SPF 30+ to sun-exposed areas, reapply every 2 hours as needed.  Recommend staying in the shade or wearing long sleeves, sun glasses (UVA+UVB protection) and wide brim hats (4-inch brim around the entire circumference of the hat). Call for new or changing lesions.  Destruction of lesion - forehead x 6  Destruction method: cryotherapy   Informed consent: discussed and consent obtained   Lesion destroyed using liquid nitrogen: Yes   Region frozen until ice ball extended beyond lesion: Yes   Outcome: patient tolerated procedure well with no complications   Post-procedure details: wound care instructions given   Additional details:  Prior to procedure, discussed risks of blister formation, small wound, skin dyspigmentation, or rare scar following cryotherapy. Recommend Vaseline ointment to treated areas while healing.   Rosacea face  Controlled on treatment Rosacea is a chronic progressive skin condition usually affecting the face of adults, causing redness and/or acne bumps. It is treatable but not curable. It sometimes affects the eyes (ocular rosacea) as well. It may respond to topical and/or systemic medication and can flare with stress, sun exposure, alcohol, exercise and some foods.  Daily application of broad spectrum spf 30+ sunscreen to face is recommended to reduce flares.  Continue Soolantra Cream qhs. Pt has.  Continue doxycycline 100mg  take 1 po QD with food prn flares dsp #30 3Rf.  Doxycycline should be taken with food to prevent nausea. Do not lay down for 30 minutes after taking. Be cautious with sun exposure and use good sun protection while on this medication. Pregnant women should not take  this medication.    Related Medications Ivermectin (SOOLANTRA) 1 % CREA Apply to cheeks and nose every night for rosacea.  Lentigines sternal notch  Benign, observe.    Seborrheic dermatitis ears  Itching in ears controlled with treatment  Seborrheic Dermatitis  -  is a chronic persistent rash characterized by pinkness and scaling most commonly of the mid face but also can occur on the scalp (dandruff), ears; mid chest, mid back and groin.  It tends to be exacerbated by stress and cooler weather.  People who have neurologic disease may experience new onset or exacerbation of existing seborrheic dermatitis.  The condition is not curable but treatable and can be controlled.  Continue mometasone lotion qd/bid prn flares 2Rf. Rx put on hold.   Topical steroids (such as triamcinolone, fluocinolone, fluocinonide, mometasone, clobetasol, halobetasol, betamethasone, hydrocortisone) can cause thinning and lightening of the skin if they are used for too long in the same area. Your physician has selected the right strength medicine for your problem and area affected on the body. Please use your medication only as directed by your physician to prevent side effects.    Related Medications mometasone (ELOCON) 0.1 % lotion Apply to ears 1-2 times a day as needed for scaling/itch.  Neoplasm of uncertain behavior of skin Right  lower pretibia  Epidermal / dermal shaving  Lesion diameter (cm):  0.5 Informed consent: discussed and consent obtained   Patient was prepped and draped in usual sterile fashion: Area prepped with alcohol. Anesthesia: the lesion was anesthetized in a standard fashion   Anesthetic:  1% lidocaine w/ epinephrine 1-100,000 buffered w/ 8.4% NaHCO3 Instrument used: flexible razor blade   Hemostasis achieved with: pressure, aluminum chloride and electrodesiccation   Outcome: patient tolerated procedure well   Post-procedure details: wound care instructions given    Post-procedure details comment:  Ointment and small bandage applied  Specimen 1 - Surgical pathology Differential Diagnosis: Lentigo vs Nevus r/o Atypia Check Margins: Yes 4.0 mm brown macule, slight darker edge  Hemangiomas - Red papules - Discussed benign nature - Observe - Call for any changes  Return in about 6 months (around 03/31/2022) for AKs.  IJamesetta Orleans, CMA, am acting as scribe for Brendolyn Patty, MD . Documentation: I have reviewed the above documentation for accuracy and completeness, and I agree with the above.  Brendolyn Patty MD

## 2021-10-01 NOTE — Patient Instructions (Addendum)
Doxycycline should be taken with food to prevent nausea. Do not lay down for 30 minutes after taking. Be cautious with sun exposure and use good sun protection while on this medication. Pregnant women should not take this medication.   Wound Care Instructions  Cleanse wound gently with soap and water once a day then pat dry with clean gauze. Apply a thing coat of Petrolatum (petroleum jelly, "Vaseline") over the wound (unless you have an allergy to this). We recommend that you use a new, sterile tube of Vaseline. Do not pick or remove scabs. Do not remove the yellow or white "healing tissue" from the base of the wound.  Cover the wound with fresh, clean, nonstick gauze and secure with paper tape. You may use Band-Aids in place of gauze and tape if the would is small enough, but would recommend trimming much of the tape off as there is often too much. Sometimes Band-Aids can irritate the skin.  You should call the office for your biopsy report after 1 week if you have not already been contacted.  If you experience any problems, such as abnormal amounts of bleeding, swelling, significant bruising, significant pain, or evidence of infection, please call the office immediately.  FOR ADULT SURGERY PATIENTS: If you need something for pain relief you may take 1 extra strength Tylenol (acetaminophen) AND 2 Ibuprofen (200mg  each) together every 4 hours as needed for pain. (do not take these if you are allergic to them or if you have a reason you should not take them.) Typically, you may only need pain medication for 1 to 3 days.    If You Need Anything After Your Visit  If you have any questions or concerns for your doctor, please call our main line at [phone] and press option 4 to reach your doctor's medical assistant. If no one answers, please leave a voicemail as directed and we will return your call as soon as possible. Messages left after 4 pm will be answered the following business day.   You  may also send [place] a message via [place]. We typically respond to MyChart messages within 1-2 business days.  For prescription refills, please ask your pharmacy to contact our office. Our fax number is [phone].  If you have an urgent issue when the clinic is closed that cannot wait until the next business day, you can page your doctor at the number below.    Please note that while we do our best to be available for urgent issues outside of office hours, we are not available 24/7.   If you have an urgent issue and are unable to reach [place], you may choose to seek medical care at your doctor's office, retail clinic, urgent care center, or emergency room.  If you have a medical emergency, please immediately call 911 or go to the emergency department.  Pager Numbers  - Dr. Nehemiah Massed: [phone]  - Dr. Laurence Ferrari: [phone]  - Dr. Nicole Kindred: [phone]  In the event of inclement weather, please call our main line at [phone] for an update on the status of any delays or closures.  Dermatology Medication Tips: Please keep the boxes that topical medications come in in order to help keep track of the instructions about where and how to use these. Pharmacies typically print the medication instructions only on the boxes and not directly on the medication tubes.   If your medication is too expensive, please contact our office at [phone] option 4 or send [place] a message through  MyChart.   We are unable to tell what your co-pay for medications will be in advance as this is different depending on your insurance coverage. However, we may be able to find a substitute medication at lower cost or fill out paperwork to get insurance to cover a needed medication.   If a prior authorization is required to get your medication covered by your insurance company, please allow [place] 1-2 business days to complete this process.  Drug prices often vary depending on where the prescription is filled and some  pharmacies may offer cheaper prices.  The website www.goodrx.com contains coupons for medications through different pharmacies. The prices here do not account for what the cost may be with help from insurance (it may be cheaper with your insurance), but the website can give you the price if you did not use any insurance.  - You can print the associated coupon and take it with your prescription to the pharmacy.  - You may also stop by our office during regular business hours and pick up a GoodRx coupon card.  - If you need your prescription sent electronically to a different pharmacy, notify our office through [facility] or by phone at [phone] option 4.     Si Usted Necesita Algo Despus de Su Visita  Tambin puede enviarnos un mensaje a travs de Pharmacist, community. Por lo general respondemos a los mensajes de MyChart en el transcurso de 1 a 2 das hbiles.  Para renovar recetas, por favor pida a su farmacia que se ponga en contacto con nuestra oficina. Harland Dingwall de fax es [place] [phone].  Si tiene un asunto urgente cuando la clnica est cerrada y que no puede esperar hasta el siguiente da hbil, puede llamar/localizar a su doctor(a) al nmero que aparece a continuacin.   Por favor, tenga en cuenta que aunque hacemos todo lo posible para estar disponibles para asuntos urgentes fuera del horario de [place], no estamos disponibles las 24 horas del da, los 7 das de la [place].   Si tiene un problema urgente y no puede comunicarse con nosotros, puede optar por buscar atencin mdica  en el consultorio de su doctor(a), en una clnica privada, en un centro de atencin urgente o en una sala de emergencias.  Si tiene Engineering geologist, por favor llame inmediatamente al 911 o vaya a la sala de emergencias.  Nmeros de bper  - Dr. Nehemiah Massed: [phone]  - Dra. Moye: [phone]  - Dra. Nicole Kindred: [phone]  En caso de inclemencias del [place], por favor llame a Johnsie Kindred  principal al [phone] para una actualizacin sobre el [place] de cualquier retraso o cierre.  Consejos para la medicacin en dermatologa: Por favor, guarde las cajas en las que vienen los medicamentos de uso tpico para ayudarle a seguir las instrucciones sobre dnde y cmo usarlos. Las farmacias generalmente imprimen las instrucciones del medicamento slo en las cajas y no directamente en los tubos del [place].   Si su medicamento es muy caro, por favor, pngase en contacto con Zigmund Daniel llamando al [phone] y presione la opcin 4 o envenos un mensaje a travs de Pharmacist, community.   No podemos decirle cul ser su copago por los medicamentos por adelantado ya que esto es diferente dependiendo de la cobertura de su seguro. Sin embargo, es posible que podamos encontrar un medicamento sustituto a Electrical engineer un formulario para que el seguro cubra el medicamento que se considera necesario.   Si se requiere una autorizacin previa  para que su compaa de seguros [place] su medicamento, por favor permtanos de 1 a 2 das hbiles para completar este proceso.  Los precios de los medicamentos varan con frecuencia dependiendo del Environmental consultant de dnde se surte la receta y alguna farmacias pueden ofrecer precios ms baratos.  El sitio web www.goodrx.com tiene cupones para medicamentos de Airline pilot. Los precios aqu no tienen en cuenta lo que podra costar con la ayuda del seguro (puede ser ms barato con su seguro), pero el sitio web puede darle el precio si no utiliz Research scientist (physical sciences).  - Puede imprimir el cupn correspondiente y llevarlo con su receta a la farmacia.  - Tambin puede pasar por nuestra oficina durante el horario de atencin regular y Charity fundraiser una tarjeta de cupones de GoodRx.  - Si necesita que su receta se enve electrnicamente a una farmacia diferente, informe a nuestra oficina a travs de MyChart de [place] o por telfono llamando al [phone] y presione la opcin  4.

## 2021-10-03 ENCOUNTER — Telehealth: Payer: Self-pay

## 2021-10-03 NOTE — Telephone Encounter (Signed)
Lft pt msg to call for bx result./sh 

## 2021-10-03 NOTE — Telephone Encounter (Signed)
-----   Message from Brendolyn Patty, MD sent at 10/03/2021 12:07 PM EST ----- Skin , right lower pretibia PIGMENTED SEBORRHEIC KERATOSIS, EARLY  Benign - please call patient

## 2021-10-07 ENCOUNTER — Telehealth: Payer: Self-pay

## 2021-10-07 NOTE — Telephone Encounter (Signed)
Discussed biopsy results with pt  °

## 2021-10-07 NOTE — Telephone Encounter (Signed)
-----   Message from Brendolyn Patty, MD sent at 10/03/2021 12:07 PM EST ----- Skin , right lower pretibia PIGMENTED SEBORRHEIC KERATOSIS, EARLY  Benign - please call patient

## 2021-10-10 ENCOUNTER — Ambulatory Visit (INDEPENDENT_AMBULATORY_CARE_PROVIDER_SITE_OTHER): Payer: 59

## 2021-10-10 ENCOUNTER — Other Ambulatory Visit: Payer: Self-pay

## 2021-10-10 ENCOUNTER — Ambulatory Visit (INDEPENDENT_AMBULATORY_CARE_PROVIDER_SITE_OTHER): Payer: 59 | Admitting: Podiatry

## 2021-10-10 DIAGNOSIS — S90121A Contusion of right lesser toe(s) without damage to nail, initial encounter: Secondary | ICD-10-CM | POA: Diagnosis not present

## 2021-10-10 DIAGNOSIS — Q666 Other congenital valgus deformities of feet: Secondary | ICD-10-CM | POA: Diagnosis not present

## 2021-10-10 DIAGNOSIS — M79671 Pain in right foot: Secondary | ICD-10-CM

## 2021-10-16 ENCOUNTER — Encounter: Payer: Self-pay | Admitting: Podiatry

## 2021-10-16 NOTE — Progress Notes (Signed)
Subjective:  Patient ID: Kristy Gibson, female    DOB: [birth year],  MRN: [id number]  Chief Complaint  Patient presents with   Toe Pain    Right foot 2nd toe painful and swollen for about 4 months no noted injuries     60 y.o. female presents with the above complaint.  Patient presents with complaint of right second digit painful toenail and has been swollen for about 4 months.  No noted injuries.  She states she plays a lot of pickleball.  She states that she has to wear the pickleball shoes which can be very tight.  She wanted get it evaluated.  She also has flatfoot for which she would like to discuss orthotics.   Review of Systems: Negative except as noted in the HPI. Denies N/V/F/Ch.  Past Medical History:  Diagnosis Date   Asthma    Hx of basal cell carcinoma 2009   post neck    Current Outpatient Medications:    citalopram (CELEXA) 10 MG tablet, Take 1 tablet by mouth daily., Disp: , Rfl:    doxycycline (VIBRAMYCIN) 100 MG capsule, Take 1 capsule (100 mg total) by mouth daily. Take with food and drink, Disp: 30 capsule, Rfl: 3   esomeprazole (NEXIUM) 40 MG capsule, Take 40 mg by mouth daily., Disp: , Rfl:    Ivermectin (SOOLANTRA) 1 % CREA, Apply to cheeks and nose every night for rosacea., Disp: 45 g, Rfl: 5   mometasone (ELOCON) 0.1 % lotion, Apply to ears 1-2 times a day as needed for scaling/itch., Disp: 60 mL, Rfl: 2  Social History   Tobacco Use  Smoking Status Never  Smokeless Tobacco Never    No Known Allergies Objective:  There were no vitals filed for this visit. There is no height or weight on file to calculate BMI. Constitutional Well developed. Well nourished.  Vascular Dorsalis pedis pulses palpable bilaterally. Posterior tibial pulses palpable bilaterally. Capillary refill normal to all digits.  No cyanosis or clubbing noted. Pedal hair growth normal.  Neurologic Normal speech. Oriented to person, place, and time. Epicritic sensation to light touch  grossly present bilaterally.  Dermatologic Nails well groomed and normal in appearance. No open wounds. No skin lesions.  Orthopedic: Pain on palpation right second digit.  Pain to the proximal nail fold.  Edema noted.  No redness noted.  Signs of microtrauma/nail contusion noted.   Radiographs: 3 views of skeletally mature adultNo complication noted.  No fractures noted.  No bony abnormalities noted.  Slight deviation of the second digit noted. Assessment:   1. Contusion of second toe of right foot, initial encounter   2. Pes planovalgus    Plan:  Patient was evaluated and treated and all questions answered.  Right second digit proximal nail fold contusion -All questions and concerns were discussed with the patient in extensive detail. -There appears to be signs of microtrauma to the proximal nail fold leading to contusion of the nail.  I discussed with her to make shoe gear modification.  I believe she will also benefit from surgical shoe.  I would like for her to wear surgical shoe for next few weeks. -I think she will benefit from holding off on playing pickle ball for now.  Pes planovalgus -I explained to patient the etiology of pes planovalgus and relationship with arch support/heel support and various treatment options were discussed.  Given patient foot structure in the setting of [place] fasciitis I believe patient will benefit from custom-made orthotics to help control the  hindfoot motion support the arch of the foot and take the stress away from plantar fascial.  Patient agrees with the plan like to proceed with orthotics -Patient was casted for orthotics   No follow-ups on file.

## 2021-11-07 ENCOUNTER — Ambulatory Visit: Payer: 59 | Admitting: Podiatry

## 2021-11-11 ENCOUNTER — Telehealth: Payer: Self-pay | Admitting: Podiatry

## 2021-11-11 NOTE — Telephone Encounter (Signed)
Orthotics in [place] to be taken to [facility].. lvm for pt to call to schedule an appt to pick them up, pt needs [place] appt.

## 2021-11-15 ENCOUNTER — Ambulatory Visit: Payer: 59

## 2021-11-15 ENCOUNTER — Other Ambulatory Visit: Payer: Self-pay

## 2021-11-15 DIAGNOSIS — Q666 Other congenital valgus deformities of feet: Secondary | ICD-10-CM

## 2021-11-15 NOTE — Progress Notes (Signed)
SITUATION: °Reason for Visit: Fitting and Delivery of Custom Fabricated Foot Orthoses °Patient Report: Patient reports comfort and is satisfied with device. ° °OBJECTIVE DATA: °Patient History / Diagnosis:   °  ICD-10-CM   °1. Pes planovalgus  Q66.6   °  ° ° °Provided Device:  Custom functional foot orthotics ° °GOAL OF ORTHOSIS °- Improve gait °- Decrease energy expenditure °- Improve Balance °- Provide Triplanar stability of foot complex °- Facilitate motion ° °ACTIONS PERFORMED °Patient was fit with foot orthotics trimmed to shoe last. Patient tolerated fittign procedure. Device was modified as follows to better fit patient: °- Toe plate was trimmed to shoe last ° °Patient was provided with verbal and written instruction and demonstration regarding donning, doffing, wear, care, proper fit, function, purpose, cleaning, and use of the orthosis and in all related precautions and risks and benefits regarding the orthosis. ° °Patient was also provided with verbal instruction regarding how to report any failures or malfunctions of the orthosis and necessary follow up care. Patient was also instructed to contact our office regarding any change in status that may affect the function of the orthosis. ° °Patient demonstrated independence with proper donning, doffing, and fit and verbalized understanding of all instructions. ° °PLAN: °Patient is to follow up in one week or as necessary (PRN). All questions were answered and concerns addressed. Plan of care was discussed with and agreed upon by the patient. ° °

## 2022-03-20 ENCOUNTER — Other Ambulatory Visit: Payer: Self-pay | Admitting: Emergency Medicine

## 2022-04-14 ENCOUNTER — Ambulatory Visit (INDEPENDENT_AMBULATORY_CARE_PROVIDER_SITE_OTHER): Payer: 59 | Admitting: Dermatology

## 2022-04-14 DIAGNOSIS — D1800 Hemangioma unspecified site: Secondary | ICD-10-CM | POA: Diagnosis not present

## 2022-04-14 DIAGNOSIS — Z872 Personal history of diseases of the skin and subcutaneous tissue: Secondary | ICD-10-CM

## 2022-04-14 DIAGNOSIS — L814 Other melanin hyperpigmentation: Secondary | ICD-10-CM | POA: Diagnosis not present

## 2022-04-14 DIAGNOSIS — L821 Other seborrheic keratosis: Secondary | ICD-10-CM

## 2022-04-14 DIAGNOSIS — L309 Dermatitis, unspecified: Secondary | ICD-10-CM

## 2022-04-14 MED ORDER — CLOBETASOL PROPIONATE 0.05 % EX CREA: TOPICAL_CREAM | CUTANEOUS | 0 refills | Status: DC

## 2022-04-14 NOTE — Progress Notes (Signed)
Follow-Up Visit   Subjective  Kristy Gibson is a 61 y.o. female who presents for the following: Actinic Keratosis (6 months f/u on precancers on her forehead ). Pt c/o itchy rash on her hands that she would like checked today. Check a few brown spots on her legs.    The following portions of the chart were reviewed this encounter and updated as appropriate:       Review of Systems:  No other skin or systemic complaints except as noted in HPI or Assessment and Plan.  Objective  Well appearing patient in no apparent distress; mood and affect are within normal limits.  A focused examination was performed including face. Relevant physical exam findings are noted in the Assessment and Plan.  Left Hand - Anterior Erythema and scale on the bilateral 5th fingertips and left 4th finger   right lower pretibial 2.0 mm brown macule  right knee, left calf, lower sternum, right upper arm Waxy tan macules/papules, 2.0 cm waxy tan patch on the right upper arm     Assessment & Plan  Hand dermatitis Left Hand - Anterior  Chronic and persistent condition with duration or expected duration over one year. Condition is bothersome/symptomatic for patient. Currently flared.   Hand dermatitis (eczema) is a chronic, relapsing, pruritic condition that can significantly affect quality of life. It is often associated with allergic rhinitis and/or asthma and can require treatment with topical medications, phototherapy, or in severe cases biologic injectable medication (Dupixent; Adbry) or Oral JAK inhibitors.    Start Clobetasol .05% cream apply to affected hands qd-bid, Avoid applying to face, groin, and axilla. Use as directed. Long-term use can cause thinning of the skin.  Start otc Hydrocortisone apply to affected areas on face qd prn flares only   Topical steroids (such as triamcinolone, fluocinolone, fluocinonide, mometasone, clobetasol, halobetasol, betamethasone, hydrocortisone) can cause  thinning and lightening of the skin if they are used for too long in the same area. Your physician has selected the right strength medicine for your problem and area affected on the body. Please use your medication only as directed by your physician to prevent side effects.    Related Medications clobetasol cream (TEMOVATE) 0.05 % Apply to affected hands qd-bid prn, Avoid applying to face, groin, and axilla. Use as directed. Long-term use can cause thinning of the skin.  Lentigines right lower pretibial  - Due to sun exposure - Benign-appearing, observe - Recommend daily broad spectrum sunscreen SPF 30+ to sun-exposed areas, reapply every 2 hours as needed. - Call for any changes   Seborrheic keratosis right knee, left calf, lower sternum, right upper arm  Reassured benign age-related growth.  Recommend observation.  Discussed cryotherapy if spot(s) become irritated or inflamed.   Hemangiomas Lower sternum  - Red papules - Discussed benign nature - Observe - Call for any changes   History of PreCancerous Actinic Keratosis forehead  - site(s) of PreCancerous Actinic Keratosis clear today. - these may recur and new lesions may form requiring treatment to prevent transformation into skin cancer - observe for new or changing spots and contact [place] for appointment if occur - photoprotection with sun protective clothing; sunglasses and broad spectrum sunscreen with SPF of at least 30 + and frequent self skin exams recommended - yearly exams by a dermatologist recommended for persons with history of PreCancerous Actinic Keratoses   Return in about 5 months (around 09/14/2022) for TBSE .  IMarye Round, CMA, am acting as scribe for  Brendolyn Patty, MD .   Documentation: I have reviewed the above documentation for accuracy and completeness, and I agree with the above.  Brendolyn Patty MD

## 2022-04-14 NOTE — Patient Instructions (Addendum)
Melanoma ABCDEs  Melanoma is the most dangerous type of skin cancer, and is the leading cause of death from skin disease.  You are more likely to develop melanoma if you: Have light-colored skin, light-colored eyes, or red or blond hair Spend a lot of time in the sun Tan regularly, either outdoors or in a tanning bed Have had blistering sunburns, especially during childhood Have a close family member who has had a melanoma Have atypical moles or large birthmarks  Early detection of melanoma is key since treatment is typically straightforward and cure rates are extremely high if we catch it early.   The first sign of melanoma is often a change in a mole or a new dark spot.  The ABCDE system is a way of remembering the signs of melanoma.  A for asymmetry:  The two halves do not match. B for border:  The edges of the growth are irregular. C for color:  A mixture of colors are present instead of an even brown color. D for diameter:  Melanomas are usually (but not always) greater than 6mm - the size of a pencil eraser. E for evolution:  The spot keeps changing in size, shape, and color.  Please check your skin once per month between visits. You can use a small mirror in front and a large mirror behind you to keep an eye on the back side or your body.   If you see any new or changing lesions before your next follow-up, please call to schedule a visit.  Please continue daily skin protection including broad spectrum sunscreen SPF 30+ to sun-exposed areas, reapplying every 2 hours as needed when you're outdoors.    Due to recent changes in healthcare laws, you may see results of your pathology and/or laboratory studies on MyChart before the doctors have had a chance to review them. We understand that in some cases there may be results that are confusing or concerning to you. Please understand that not all results are received at the same time and often the doctors may need to interpret multiple  results in order to provide you with the best plan of care or course of treatment. Therefore, we ask that you please give us 2 business days to thoroughly review all your results before contacting the office for clarification. Should we see a critical lab result, you will be contacted sooner.   If You Need Anything After Your Visit  If you have any questions or concerns for your doctor, please call our main line at [phone] and press option 4 to reach your doctor's medical assistant. If no one answers, please leave a voicemail as directed and we will return your call as soon as possible. Messages left after 4 pm will be answered the following business day.   You may also send us a message via MyChart. We typically respond to MyChart messages within 1-2 business days.  For prescription refills, please ask your pharmacy to contact our office. Our fax number is [phone].  If you have an urgent issue when the clinic is closed that cannot wait until the next business day, you can page your doctor at the number below.    Please note that while we do our best to be available for urgent issues outside of office hours, we are not available 24/7.   If you have an urgent issue and are unable to reach us, you may choose to seek medical care at your doctor's office, retail clinic, urgent care   center, or emergency room.  If you have a medical emergency, please immediately call 911 or go to the emergency department.  Pager Numbers  - Dr. Kowalski: [phone]  - Dr. Moye: [phone]  - Dr. Stewart: [phone]  In the event of inclement weather, please call our main line at [phone] for an update on the status of any delays or closures.  Dermatology Medication Tips: Please keep the boxes that topical medications come in in order to help keep track of the instructions about where and how to use these. Pharmacies typically print the medication instructions only on the boxes and not  directly on the medication tubes.   If your medication is too expensive, please contact our office at [phone] option 4 or send us a message through MyChart.   We are unable to tell what your co-pay for medications will be in advance as this is different depending on your insurance coverage. However, we may be able to find a substitute medication at lower cost or fill out paperwork to get insurance to cover a needed medication.   If a prior authorization is required to get your medication covered by your insurance company, please allow us 1-2 business days to complete this process.  Drug prices often vary depending on where the prescription is filled and some pharmacies may offer cheaper prices.  The website www.goodrx.com contains coupons for medications through different pharmacies. The prices here do not account for what the cost may be with help from insurance (it may be cheaper with your insurance), but the website can give you the price if you did not use any insurance.  - You can print the associated coupon and take it with your prescription to the pharmacy.  - You may also stop by our office during regular business hours and pick up a GoodRx coupon card.  - If you need your prescription sent electronically to a different pharmacy, notify our office through [facility] MyChart or by phone at [phone] option 4.     Si Usted Necesita Algo Despus de Su Visita  Tambin puede enviarnos un mensaje a travs de MyChart. Por lo general respondemos a los mensajes de MyChart en el transcurso de 1 a 2 das hbiles.  Para renovar recetas, por favor pida a su farmacia que se ponga en contacto con nuestra oficina. Nuestro nmero de fax es el [phone].  Si tiene un asunto urgente cuando la clnica est cerrada y que no puede esperar hasta el siguiente da hbil, puede llamar/localizar a su doctor(a) al nmero que aparece a continuacin.   Por favor, tenga en cuenta que aunque hacemos  todo lo posible para estar disponibles para asuntos urgentes fuera del horario de oficina, no estamos disponibles las 24 horas del da, los 7 das de la semana.   Si tiene un problema urgente y no puede comunicarse con nosotros, puede optar por buscar atencin mdica  en el consultorio de su doctor(a), en una clnica privada, en un centro de atencin urgente o en una sala de emergencias.  Si tiene una emergencia mdica, por favor llame inmediatamente al 911 o vaya a la sala de emergencias.  Nmeros de bper  - Dr. Kowalski: [phone]  - Dra. Moye: [phone]  - Dra. Stewart: [phone]  En caso de inclemencias del tiempo, por favor llame a nuestra lnea principal al [phone] para una actualizacin sobre el estado de cualquier retraso o cierre.  Consejos para la medicacin en dermatologa: Por favor, guarde las cajas en las   que vienen los medicamentos de uso tpico para ayudarle a seguir las instrucciones sobre dnde y cmo usarlos. Las farmacias generalmente imprimen las instrucciones del medicamento slo en las cajas y no directamente en los tubos del medicamento.   Si su medicamento es muy caro, por favor, pngase en contacto con nuestra oficina llamando al [phone] y presione la opcin 4 o envenos un mensaje a travs de MyChart.   No podemos decirle cul ser su copago por los medicamentos por adelantado ya que esto es diferente dependiendo de la cobertura de su seguro. Sin embargo, es posible que podamos encontrar un medicamento sustituto a menor costo o llenar un formulario para que el seguro cubra el medicamento que se considera necesario.   Si se requiere una autorizacin previa para que su compaa de seguros cubra su medicamento, por favor permtanos de 1 a 2 das hbiles para completar este proceso.  Los precios de los medicamentos varan con frecuencia dependiendo del lugar de dnde se surte la receta y alguna farmacias pueden ofrecer precios ms baratos.  El  sitio web www.goodrx.com tiene cupones para medicamentos de diferentes farmacias. Los precios aqu no tienen en cuenta lo que podra costar con la ayuda del seguro (puede ser ms barato con su seguro), pero el sitio web puede darle el precio si no utiliz ningn seguro.  - Puede imprimir el cupn correspondiente y llevarlo con su receta a la farmacia.  - Tambin puede pasar por nuestra oficina durante el horario de atencin regular y recoger una tarjeta de cupones de GoodRx.  - Si necesita que su receta se enve electrnicamente a una farmacia diferente, informe a nuestra oficina a travs de MyChart de [place] o por telfono llamando al [phone] y presione la opcin 4.  

## 2022-11-04 ENCOUNTER — Encounter: Payer: Self-pay | Admitting: Dermatology

## 2022-11-04 ENCOUNTER — Ambulatory Visit (INDEPENDENT_AMBULATORY_CARE_PROVIDER_SITE_OTHER): Payer: 59 | Admitting: Dermatology

## 2022-11-04 VITALS — BP 137/79 | HR 59

## 2022-11-04 DIAGNOSIS — L709 Acne, unspecified: Secondary | ICD-10-CM | POA: Diagnosis not present

## 2022-11-04 DIAGNOSIS — L570 Actinic keratosis: Secondary | ICD-10-CM

## 2022-11-04 DIAGNOSIS — L309 Dermatitis, unspecified: Secondary | ICD-10-CM

## 2022-11-04 DIAGNOSIS — R238 Other skin changes: Secondary | ICD-10-CM | POA: Diagnosis not present

## 2022-11-04 DIAGNOSIS — D229 Melanocytic nevi, unspecified: Secondary | ICD-10-CM

## 2022-11-04 DIAGNOSIS — L814 Other melanin hyperpigmentation: Secondary | ICD-10-CM

## 2022-11-04 DIAGNOSIS — Z1283 Encounter for screening for malignant neoplasm of skin: Secondary | ICD-10-CM | POA: Diagnosis not present

## 2022-11-04 DIAGNOSIS — L821 Other seborrheic keratosis: Secondary | ICD-10-CM

## 2022-11-04 DIAGNOSIS — D225 Melanocytic nevi of trunk: Secondary | ICD-10-CM

## 2022-11-04 DIAGNOSIS — L578 Other skin changes due to chronic exposure to nonionizing radiation: Secondary | ICD-10-CM

## 2022-11-04 DIAGNOSIS — Z85828 Personal history of other malignant neoplasm of skin: Secondary | ICD-10-CM

## 2022-11-04 MED ORDER — CLINDAMYCIN PHOS-BENZOYL PEROX 1.2-5 % EX GEL: CUTANEOUS | 6 refills | Status: DC

## 2022-11-04 MED ORDER — CLOBETASOL PROPIONATE 0.05 % EX CREA: TOPICAL_CREAM | CUTANEOUS | 0 refills | Status: DC

## 2022-11-04 MED ORDER — DOXYCYCLINE HYCLATE 100 MG PO CAPS: ORAL_CAPSULE | ORAL | 6 refills | Status: DC

## 2022-11-04 NOTE — Patient Instructions (Addendum)
Actinic keratoses are precancerous spots that appear secondary to cumulative UV radiation exposure/sun exposure over time. They are chronic with expected duration over 1 year. A portion of actinic keratoses will progress to squamous cell carcinoma of the skin. It is not possible to reliably predict which spots will progress to skin cancer and so treatment is recommended to prevent development of skin cancer.  Recommend daily broad spectrum sunscreen SPF 30+ to sun-exposed areas, reapply every 2 hours as needed.  Recommend staying in the shade or wearing long sleeves, sun glasses (UVA+UVB protection) and wide brim hats (4-inch brim around the entire circumference of the hat). Call for new or changing lesions.   Cryotherapy Aftercare  Wash gently with soap and water everyday.   Apply Vaseline and Band-Aid daily until healed.   For Acne  Start duac gel - apply to affected areas of face for acne daily to twice daily as needed when flared.   Restart doxycycline 100 capsule - take 1 capsule by mouth daily with food and drink as needed when flared by acne  Doxycycline should be taken with food to prevent nausea. Do not lay down for 30 minutes after taking. Be cautious with sun exposure and use good sun protection while on this medication. Pregnant women should not take this medication.   Start sample of zoryve cream - apply to affected areas daily    For Hand Dermatitis  Continue clobetasol cream 0.05 % apply to affected areas of hands daily to twice daily as needed.   Avoid applying to face, groin, and axilla. Use as directed. Long-term use can cause thinning of the skin.  Topical steroids (such as triamcinolone, fluocinolone, fluocinonide, mometasone, clobetasol, halobetasol, betamethasone, hydrocortisone) can cause thinning and lightening of the skin if they are used for too long in the same area. Your physician has selected the right strength medicine for your problem and area affected on  the body. Please use your medication only as directed by your physician to prevent side effects.      Melanoma ABCDEs  Melanoma is the most dangerous type of skin cancer, and is the leading cause of death from skin disease.  You are more likely to develop melanoma if you: Have light-colored skin, light-colored eyes, or red or blond hair Spend a lot of time in the sun Tan regularly, either outdoors or in a tanning bed Have had blistering sunburns, especially during childhood Have a close family member who has had a melanoma Have atypical moles or large birthmarks  Early detection of melanoma is key since treatment is typically straightforward and cure rates are extremely high if we catch it early.   The first sign of melanoma is often a change in a mole or a new dark spot.  The ABCDE system is a way of remembering the signs of melanoma.  A for asymmetry:  The two halves do not match. B for border:  The edges of the growth are irregular. C for color:  A mixture of colors are present instead of an even brown color. D for diameter:  Melanomas are usually (but not always) greater than 81m - the size of a pencil eraser. E for evolution:  The spot keeps changing in size, shape, and color.  Please check your skin once per month between visits. You can use a small mirror in front and a large mirror behind you to keep an eye on the back side or your body.   If you see any new or  changing lesions before your next follow-up, please call to schedule a visit.  Please continue daily skin protection including broad spectrum sunscreen SPF 30+ to sun-exposed areas, reapplying every 2 hours as needed when you're outdoors.   Staying in the shade or wearing long sleeves, sun glasses (UVA+UVB protection) and wide brim hats (4-inch brim around the entire circumference of the hat) are also recommended for sun protection.    Due to recent changes in healthcare laws, you may see results of your pathology  and/or laboratory studies on MyChart before the doctors have had a chance to review them. We understand that in some cases there may be results that are confusing or concerning to you. Please understand that not all results are received at the same time and often the doctors may need to interpret multiple results in order to provide you with the best plan of care or course of treatment. Therefore, we ask that you please give [place] 2 business days to thoroughly review all your results before contacting the office for clarification. Should we see a critical lab result, you will be contacted sooner.   If You Need Anything After Your Visit  If you have any questions or concerns for your doctor, please call our main line at [phone] and press option 4 to reach your doctor's medical assistant. If no one answers, please leave a voicemail as directed and we will return your call as soon as possible. Messages left after 4 pm will be answered the following business day.   You may also send [place] a message via [place]. We typically respond to MyChart messages within 1-2 business days.  For prescription refills, please ask your pharmacy to contact our office. Our fax number is [phone].  If you have an urgent issue when the clinic is closed that cannot wait until the next business day, you can page your doctor at the number below.    Please note that while we do our best to be available for urgent issues outside of office hours, we are not available 24/7.   If you have an urgent issue and are unable to reach [place], you may choose to seek medical care at your doctor's office, retail clinic, urgent care center, or emergency room.  If you have a medical emergency, please immediately call 911 or go to the emergency department.  Pager Numbers  - Dr. Nehemiah Massed: [phone]  - Dr. Laurence Ferrari: [phone]  - Dr. Nicole Kindred: [phone]  In the event of inclement weather, please call our main line at [phone] for  an update on the status of any delays or closures.  Dermatology Medication Tips: Please keep the boxes that topical medications come in in order to help keep track of the instructions about where and how to use these. Pharmacies typically print the medication instructions only on the boxes and not directly on the medication tubes.   If your medication is too expensive, please contact our office at [phone] option 4 or send [place] a message through [place].   We are unable to tell what your co-pay for medications will be in advance as this is different depending on your insurance coverage. However, we may be able to find a substitute medication at lower cost or fill out paperwork to get insurance to cover a needed medication.   If a prior authorization is required to get your medication covered by your insurance company, please allow [place] 1-2 business days to complete this process.  Drug prices often vary depending on  where the prescription is filled and some pharmacies may offer cheaper prices.  The website www.goodrx.com contains coupons for medications through different pharmacies. The prices here do not account for what the cost may be with help from insurance (it may be cheaper with your insurance), but the website can give you the price if you did not use any insurance.  - You can print the associated coupon and take it with your prescription to the pharmacy.  - You may also stop by our office during regular business hours and pick up a GoodRx coupon card.  - If you need your prescription sent electronically to a different pharmacy, notify our office through [facility] or by phone at [phone] option 4.     Si Usted Necesita Algo Despus de Su Visita  Tambin puede enviarnos un mensaje a travs de Pharmacist, community. Por lo general respondemos a los mensajes de MyChart en el transcurso de 1 a 2 das hbiles.  Para renovar recetas, por favor pida a su farmacia que se ponga en contacto con  nuestra oficina. Harland Dingwall de fax es [place] [phone].  Si tiene un asunto urgente cuando la clnica est cerrada y que no puede esperar hasta el siguiente da hbil, puede llamar/localizar a su doctor(a) al nmero que aparece a continuacin.   Por favor, tenga en cuenta que aunque hacemos todo lo posible para estar disponibles para asuntos urgentes fuera del horario de [place], no estamos disponibles las 24 horas del da, los 7 das de la [place].   Si tiene un problema urgente y no puede comunicarse con nosotros, puede optar por buscar atencin mdica  en el consultorio de su doctor(a), en una clnica privada, en un centro de atencin urgente o en una sala de emergencias.  Si tiene Engineering geologist, por favor llame inmediatamente al 911 o vaya a la sala de emergencias.  Nmeros de bper  - Dr. Nehemiah Massed: [phone]  - Dra. Moye: [phone]  - Dra. Nicole Kindred: [phone]  En caso de inclemencias del [place], por favor llame a Johnsie Kindred principal al [phone] para una actualizacin sobre el [place] de cualquier retraso o cierre.  Consejos para la medicacin en dermatologa: Por favor, guarde las cajas en las que vienen los medicamentos de uso tpico para ayudarle a seguir las instrucciones sobre dnde y cmo usarlos. Las farmacias generalmente imprimen las instrucciones del medicamento slo en las cajas y no directamente en los tubos del [place].   Si su medicamento es muy caro, por favor, pngase en contacto con Zigmund Daniel llamando al [phone] y presione la opcin 4 o envenos un mensaje a travs de Pharmacist, community.   No podemos decirle cul ser su copago por los medicamentos por adelantado ya que esto es diferente dependiendo de la cobertura de su seguro. Sin embargo, es posible que podamos encontrar un medicamento sustituto a Electrical engineer un formulario para que el seguro cubra el medicamento que se considera necesario.   Si se requiere una autorizacin  previa para que su compaa de seguros [place] su medicamento, por favor permtanos de 1 a 2 das hbiles para completar este proceso.  Los precios de los medicamentos varan con frecuencia dependiendo del Environmental consultant de dnde se surte la receta y alguna farmacias pueden ofrecer precios ms baratos.  El sitio web www.goodrx.com tiene cupones para medicamentos de Airline pilot. Los precios aqu no tienen en cuenta lo que podra costar con la ayuda del seguro (puede ser ms barato con su seguro), [employer]  sitio web puede darle el precio si no Field seismologist.  - Puede imprimir el cupn correspondiente y llevarlo con su receta a la farmacia.  - Tambin puede pasar por nuestra oficina durante el horario de atencin regular y Charity fundraiser una tarjeta de cupones de GoodRx.  - Si necesita que su receta se enve electrnicamente a una farmacia diferente, informe a nuestra oficina a travs de MyChart de [place] o por telfono llamando al [phone] y presione la opcin 4.

## 2022-11-04 NOTE — Progress Notes (Signed)
Follow-Up Visit   Subjective  Kristy Gibson is a 62 y.o. female who presents for the following: Annual Exam (Tbse hx of rosacea, hx of hand dermatitis, hx of bcc, hx of ak).  She has been breaking out on her cheeks and chin, and bumps are not healing.  She uses clobetasol for hand dermatitis and needs a rf.  The patient presents for Total-Body Skin Exam (TBSE) for skin cancer screening and mole check.  The patient has spots, moles and lesions to be evaluated, some may be new or changing and the patient has concerns that these could be cancer.   The following portions of the chart were reviewed this encounter and updated as appropriate:      Review of Systems: No other skin or systemic complaints except as noted in HPI or Assessment and Plan.   Objective  Well appearing patient in no apparent distress; mood and affect are within normal limits.  A full examination was performed including scalp, head, eyes, ears, nose, lips, neck, chest, axillae, abdomen, back, buttocks, bilateral upper extremities, bilateral lower extremities, hands, feet, fingers, toes, fingernails, and toenails. All findings within normal limits unless otherwise noted below.  face Resolving inflammatory papules on chin and cheeks   bilateral hands Erythema with xerosis on fingertips with small fissures   left forehead and left upper temple x 3 (3) Erythematous thin papules/macules with gritty scale.   left lower lateral mucosal lip 2.5 cm soft purple papule   Right Lower Back 4 mm triangle shaped brown macule  right lower pretibia 4 mm brown macule slight darker edge    Assessment & Plan  Acne, unspecified acne type face  Chronic and persistent condition with duration or expected duration over one year. Condition is bothersome/symptomatic for patient. Currently flared. Was treated for rosacea, but current flare clinically more c/w acne.  Will d/c soolantra since not helping  Restart doxycycline 100 mg  capsule can take 1 cap daily when flared with food and drink.  Doxycycline should be taken with food to prevent nausea. Do not lay down for 30 minutes after taking. Be cautious with sun exposure and use good sun protection while on this medication. Pregnant women should not take this medication.    Start duac gel - apply topically qd/bid to aa as needed for breakouts   Start Zoryve cream use to affected areas qd of face to help inflamed areas heal  Sample given   Discussed spironolactone - patient deferred at this time  Spironolactone can cause increased urination and cause blood pressure to decrease. Please watch for signs of lightheadedness and be cautious when changing position. It can sometimes cause breast tenderness or an irregular period in premenopausal women. It can also increase potassium. The increase in potassium usually is not a concern unless you are taking other medicines that also increase potassium, so please be sure your doctor knows all of the other medications you are taking. This medication should not be taken by pregnant women.  This medicine should also not be taken together with sulfa drugs like Bactrim (trimethoprim/sulfamethexazole).    Clindamycin-Benzoyl Per, Refr, (DUAC) gel - face Apply topically qd/bid to affected breakouts on face prn for acne flares  doxycycline (VIBRAMYCIN) 100 MG capsule - face Take 1 cap po qd with food and drink for acne flares  Hand dermatitis bilateral hands  Chronic and persistent condition with duration or expected duration over one year. Condition is symptomatic/ bothersome to patient. Not currently at goal.  Hand Dermatitis is a chronic type of eczema that can come and go on the hands and fingers.  While there is no cure, the rash and symptoms can be managed with topical prescription medications, and for more severe cases, with systemic medications.  Recommend mild soap and routine use of moisturizing cream after handwashing.   Minimize soap/water exposure when possible.    Continue clobetasol cream 0.05 % - apply to aa hands qd/bid prn   Avoid applying to face, groin, and axilla. Use as directed. Long-term use can cause thinning of the skin.  Topical steroids (such as triamcinolone, fluocinolone, fluocinonide, mometasone, clobetasol, halobetasol, betamethasone, hydrocortisone) can cause thinning and lightening of the skin if they are used for too long in the same area. Your physician has selected the right strength medicine for your problem and area affected on the body. Please use your medication only as directed by your physician to prevent side effects.    Continue with mild soap and moisturizers after handwashing  clobetasol cream (TEMOVATE) 0.05 % - bilateral hands Apply to affected hands qd-bid prn, Avoid applying to face, groin, and axilla. Use as directed. Long-term use can cause thinning of the skin.  Actinic keratosis (3) left forehead and left upper temple x 3  Discussed possible  5-fluorouracil/calcipotriene cream start in future  Actinic keratoses are precancerous spots that appear secondary to cumulative UV radiation exposure/sun exposure over time. They are chronic with expected duration over 1 year. A portion of actinic keratoses will progress to squamous cell carcinoma of the skin. It is not possible to reliably predict which spots will progress to skin cancer and so treatment is recommended to prevent development of skin cancer.  Recommend daily broad spectrum sunscreen SPF 30+ to sun-exposed areas, reapply every 2 hours as needed.  Recommend staying in the shade or wearing long sleeves, sun glasses (UVA+UVB protection) and wide brim hats (4-inch brim around the entire circumference of the hat). Call for new or changing lesions.  Destruction of lesion - left forehead and left upper temple x 3  Destruction method: cryotherapy   Informed consent: discussed and consent obtained   Lesion  destroyed using liquid nitrogen: Yes   Region frozen until ice ball extended beyond lesion: Yes   Outcome: patient tolerated procedure well with no complications   Post-procedure details: wound care instructions given   Additional details:  Prior to procedure, discussed risks of blister formation, small wound, skin dyspigmentation, or rare scar following cryotherapy. Recommend Vaseline ointment to treated areas while healing.   Venous lake left lower lateral mucosal lip  Benign, observe.  Pt is interested in treatment options  Discussed cosmetic procedure (ED), noncovered.  $60 for 1st lesion and $15 for each additional lesion if done on the same day.  Maximum charge $350.  One touch-up treatment included no charge. Discussed risks of treatment including dyspigmentation, small scar, and/or recurrence. Recommend daily broad spectrum sunscreen SPF 30+/photoprotection to treated areas once healed.  Patient deferred treatment at this time.  Patient may have treated at next follow up.  Nevus Right Lower Back  Vs lentigo  Benign-appearing. Stable compared to previous visit. Observation.  Call clinic for new or changing moles.  Recommend daily use of broad spectrum spf 30+ sunscreen to sun-exposed areas.    Lentigines right lower pretibia   - Benign-appearing, observe - Recommend daily broad spectrum sunscreen SPF 30+ to sun-exposed areas, reapply every 2 hours as needed. - Call for any changes   Lentigines - Scattered  tan macules - Due to sun exposure - Benign-appearing, observe - Recommend daily broad spectrum sunscreen SPF 30+ to sun-exposed areas, reapply every 2 hours as needed. - Call for any changes  Seborrheic Keratoses - Stuck-on, waxy, tan-brown papules and/or plaques  - Benign-appearing - Discussed benign etiology and prognosis. - Observe - Call for any changes  Melanocytic Nevi - Tan-brown and/or pink-flesh-colored symmetric macules and papules - Benign  appearing on exam today - Observation - Call clinic for new or changing moles - Recommend daily use of broad spectrum spf 30+ sunscreen to sun-exposed areas.   Hemangiomas - Red papules, including chest - Discussed benign nature - Observe - Call for any changes  Actinic Damage - Chronic condition, secondary to cumulative UV/sun exposure - diffuse scaly erythematous macules with underlying dyspigmentation - Recommend daily broad spectrum sunscreen SPF 30+ to sun-exposed areas, reapply every 2 hours as needed.  - Staying in the shade or wearing long sleeves, sun glasses (UVA+UVB protection) and wide brim hats (4-inch brim around the entire circumference of the hat) are also recommended for sun protection.  - Call for new or changing lesions.  History of Basal Cell Carcinoma of the Skin Posterior neck 2009 - No evidence of recurrence today - Recommend regular full body skin exams - Recommend daily broad spectrum sunscreen SPF 30+ to sun-exposed areas, reapply every 2 hours as needed.  - Call if any new or changing lesions are noted between office visits   Skin cancer screening performed today. Return in about 6 months (around 05/05/2023) for ak followup ,  1 year tbse . I, Ruthell Rummage, CMA, am acting as scribe for Brendolyn Patty, MD.  Documentation: I have reviewed the above documentation for accuracy and completeness, and I agree with the above.  Brendolyn Patty MD

## 2023-02-09 ENCOUNTER — Ambulatory Visit: Payer: No Typology Code available for payment source

## 2023-02-16 ENCOUNTER — Ambulatory Visit: Payer: No Typology Code available for payment source

## 2023-02-16 DIAGNOSIS — Z23 Encounter for immunization: Secondary | ICD-10-CM

## 2023-02-16 DIAGNOSIS — Z719 Counseling, unspecified: Secondary | ICD-10-CM

## 2023-02-16 NOTE — Progress Notes (Signed)
Patient seen in nurse clinic for vaccinations.  Travel to [place] and [place] in the following months. Tdap given IM right deltoid and Hep A and Hep B given IM left deltoid.  Tolerated well. [facility] updated and 2 copies provided. VIS provided. Discussed return dates for the vaccine shots.

## 2023-03-09 ENCOUNTER — Other Ambulatory Visit: Payer: Self-pay | Admitting: Family Medicine

## 2023-03-09 DIAGNOSIS — Z1231 Encounter for screening mammogram for malignant neoplasm of breast: Secondary | ICD-10-CM

## 2023-04-06 ENCOUNTER — Ambulatory Visit: Payer: No Typology Code available for payment source

## 2023-04-06 ENCOUNTER — Ambulatory Visit
Admission: RE | Admit: 2023-04-06 | Discharge: 2023-04-06 | Disposition: A | Discharge status: Home / Self Care | Payer: Commercial Managed Care - PPO | Source: Ambulatory Visit | Attending: Family Medicine | Admitting: Family Medicine

## 2023-04-06 ENCOUNTER — Other Ambulatory Visit: Payer: Self-pay | Admitting: Family Medicine

## 2023-04-06 DIAGNOSIS — Z1231 Encounter for screening mammogram for malignant neoplasm of breast: Secondary | ICD-10-CM

## 2023-04-06 DIAGNOSIS — Z23 Encounter for immunization: Secondary | ICD-10-CM

## 2023-04-06 DIAGNOSIS — Z719 Counseling, unspecified: Secondary | ICD-10-CM

## 2023-04-06 NOTE — Progress Notes (Signed)
In nurse clinic requesting Hepatitis B vaccine #2.  Also needs 2nd shingrix and requested this as well.  VISs given.  Hepatitis B vaccine and Shingrix vaccine administered; tolerated well.    [facility] updated and copy to pt.    Cherlynn Polo, RN

## 2023-05-18 ENCOUNTER — Ambulatory Visit: Payer: Commercial Managed Care - PPO | Admitting: Dermatology

## 2023-05-18 VITALS — BP 161/93 | HR 55

## 2023-05-18 DIAGNOSIS — B001 Herpesviral vesicular dermatitis: Secondary | ICD-10-CM

## 2023-05-18 DIAGNOSIS — W908XXA Exposure to other nonionizing radiation, initial encounter: Secondary | ICD-10-CM

## 2023-05-18 DIAGNOSIS — L578 Other skin changes due to chronic exposure to nonionizing radiation: Secondary | ICD-10-CM | POA: Diagnosis not present

## 2023-05-18 DIAGNOSIS — L570 Actinic keratosis: Secondary | ICD-10-CM | POA: Diagnosis not present

## 2023-05-18 DIAGNOSIS — Z872 Personal history of diseases of the skin and subcutaneous tissue: Secondary | ICD-10-CM

## 2023-05-18 DIAGNOSIS — L821 Other seborrheic keratosis: Secondary | ICD-10-CM

## 2023-05-18 DIAGNOSIS — B009 Herpesviral infection, unspecified: Secondary | ICD-10-CM

## 2023-05-18 MED ORDER — VALACYCLOVIR HCL 1 G PO TABS
ORAL_TABLET | ORAL | 1 refills | Status: DC
Start: 2023-05-18 — End: 2024-09-28

## 2023-05-18 NOTE — Patient Instructions (Addendum)
     Melanoma ABCDEs  Melanoma is the most dangerous type of skin cancer, and is the leading cause of death from skin disease.  You are more likely to develop melanoma if you: Have light-colored skin, light-colored eyes, or red or blond hair Spend a lot of time in the sun Tan regularly, either outdoors or in a tanning bed Have had blistering sunburns, especially during childhood Have a close family member who has had a melanoma Have atypical moles or large birthmarks  Early detection of melanoma is key since treatment is typically straightforward and cure rates are extremely high if we catch it early.   The first sign of melanoma is often a change in a mole or a new dark spot.  The ABCDE system is a way of remembering the signs of melanoma.  A for asymmetry:  The two halves do not match. B for border:  The edges of the growth are irregular. C for color:  A mixture of colors are present instead of an even brown color. D for diameter:  Melanomas are usually (but not always) greater than 6mm - the size of a pencil eraser. E for evolution:  The spot keeps changing in size, shape, and color.  Please check your skin once per month between visits. You can use a small mirror in front and a large mirror behind you to keep an eye on the back side or your body.   If you see any new or changing lesions before your next follow-up, please call to schedule a visit.  Please continue daily skin protection including broad spectrum sunscreen SPF 30+ to sun-exposed areas, reapplying every 2 hours as needed when you're outdoors.   Staying in the shade or wearing long sleeves, sun glasses (UVA+UVB protection) and wide brim hats (4-inch brim around the entire circumference of the hat) are also recommended for sun protection.    Due to recent changes in healthcare laws, you may see results of your pathology and/or laboratory studies on MyChart before the doctors have had a chance to review them. We  understand that in some cases there may be results that are confusing or concerning to you. Please understand that not all results are received at the same time and often the doctors may need to interpret multiple results in order to provide you with the best plan of care or course of treatment. Therefore, we ask that you please give us 2 business days to thoroughly review all your results before contacting the office for clarification. Should we see a critical lab result, you will be contacted sooner.   If You Need Anything After Your Visit  If you have any questions or concerns for your doctor, please call our main line at [phone] and press option 4 to reach your doctor's medical assistant. If no one answers, please leave a voicemail as directed and we will return your call as soon as possible. Messages left after 4 pm will be answered the following business day.   You may also send us a message via MyChart. We typically respond to MyChart messages within 1-2 business days.  For prescription refills, please ask your pharmacy to contact our office. Our fax number is [phone].  If you have an urgent issue when the clinic is closed that cannot wait until the next business day, you can page your doctor at the number below.    Please note that while we do our best to be available for urgent issues   outside of office hours, we are not available 24/7.   If you have an urgent issue and are unable to reach us, you may choose to seek medical care at your doctor's office, retail clinic, urgent care center, or emergency room.  If you have a medical emergency, please immediately call 911 or go to the emergency department.  Pager Numbers  - Dr. Kowalski: [phone]  - Dr. Moye: [phone]  - Dr. Stewart: [phone]  In the event of inclement weather, please call our main line at [phone] for an update on the status of any delays or closures.  Dermatology Medication Tips: Please  keep the boxes that topical medications come in in order to help keep track of the instructions about where and how to use these. Pharmacies typically print the medication instructions only on the boxes and not directly on the medication tubes.   If your medication is too expensive, please contact our office at [phone] option 4 or send us a message through MyChart.   We are unable to tell what your co-pay for medications will be in advance as this is different depending on your insurance coverage. However, we may be able to find a substitute medication at lower cost or fill out paperwork to get insurance to cover a needed medication.   If a prior authorization is required to get your medication covered by your insurance company, please allow us 1-2 business days to complete this process.  Drug prices often vary depending on where the prescription is filled and some pharmacies may offer cheaper prices.  The website www.goodrx.com contains coupons for medications through different pharmacies. The prices here do not account for what the cost may be with help from insurance (it may be cheaper with your insurance), but the website can give you the price if you did not use any insurance.  - You can print the associated coupon and take it with your prescription to the pharmacy.  - You may also stop by our office during regular business hours and pick up a GoodRx coupon card.  - If you need your prescription sent electronically to a different pharmacy, notify our office through [facility] My[facility] or by phone at [phone] option 4.     Si Usted Necesita Algo Despus de Su Visita  Tambin puede enviarnos un mensaje a travs de MyChart. Por lo general respondemos a los mensajes de MyChart en el transcurso de 1 a 2 das hbiles.  Para renovar recetas, por favor pida a su farmacia que se ponga en contacto con nuestra oficina. Nuestro nmero de fax es el [phone].  Si tiene un asunto urgente  cuando la clnica est cerrada y que no puede esperar hasta el siguiente da hbil, puede llamar/localizar a su doctor(a) al nmero que aparece a continuacin.   Por favor, tenga en cuenta que aunque hacemos todo lo posible para estar disponibles para asuntos urgentes fuera del horario de oficina, no estamos disponibles las 24 horas del da, los 7 das de la semana.   Si tiene un problema urgente y no puede comunicarse con nosotros, puede optar por buscar atencin mdica  en el consultorio de su doctor(a), en una clnica privada, en un centro de atencin urgente o en una sala de emergencias.  Si tiene una emergencia mdica, por favor llame inmediatamente al 911 o vaya a la sala de emergencias.  Nmeros de bper  - Dr. Kowalski: [phone]  - Dra. Moye: [phone]  - Dra. Stewart: [phone]  En caso   de inclemencias del tiempo, por favor llame a nuestra lnea principal al [phone] para una actualizacin sobre el estado de cualquier retraso o cierre.  Consejos para la medicacin en dermatologa: Por favor, guarde las cajas en las que vienen los medicamentos de uso tpico para ayudarle a seguir las instrucciones sobre dnde y cmo usarlos. Las farmacias generalmente imprimen las instrucciones del medicamento slo en las cajas y no directamente en los tubos del medicamento.   Si su medicamento es muy caro, por favor, pngase en contacto con nuestra oficina llamando al [phone] y presione la opcin 4 o envenos un mensaje a travs de MyChart.   No podemos decirle cul ser su copago por los medicamentos por adelantado ya que esto es diferente dependiendo de la cobertura de su seguro. Sin embargo, es posible que podamos encontrar un medicamento sustituto a menor costo o llenar un formulario para que el seguro cubra el medicamento que se considera necesario.   Si se requiere una autorizacin previa para que su compaa de seguros cubra su medicamento, por favor permtanos de 1 a 2  das hbiles para completar este proceso.  Los precios de los medicamentos varan con frecuencia dependiendo del lugar de dnde se surte la receta y alguna farmacias pueden ofrecer precios ms baratos.  El sitio web www.goodrx.com tiene cupones para medicamentos de diferentes farmacias. Los precios aqu no tienen en cuenta lo que podra costar con la ayuda del seguro (puede ser ms barato con su seguro), pero el sitio web puede darle el precio si no utiliz ningn seguro.  - Puede imprimir el cupn correspondiente y llevarlo con su receta a la farmacia.  - Tambin puede pasar por nuestra oficina durante el horario de atencin regular y recoger una tarjeta de cupones de GoodRx.  - Si necesita que su receta se enve electrnicamente a una farmacia diferente, informe a nuestra oficina a travs de MyChart de [facility] o por telfono llamando al [phone] y presione la opcin 4.  

## 2023-05-18 NOTE — Progress Notes (Signed)
Follow-Up Visit   Subjective  Kristy Gibson is a 62 y.o. female who presents for the following: 6 month ak follow up. Recheck areas at left temple and left forehead  Patient reports cold sore history and wants med for it  The following portions of the chart were reviewed this encounter and updated as appropriate: medications, allergies, medical history  Review of Systems:  No other skin or systemic complaints except as noted in HPI or Assessment and Plan.  Objective  Well appearing patient in no apparent distress; mood and affect are within normal limits.   A focused examination was performed of the following areas: Face   Relevant exam findings are noted in the Assessment and Plan.  Lips Clear today exam    Assessment & Plan  HSV infection Lips  Chronic and persistent condition with duration or expected duration over one year. Condition is symptomatic/ bothersome to patient. Not currently at goal.  Getting frequent cold sore outbreaks   Herpes Simplex Virus = Cold Sores = Fever Blisters is a chronic recurring blistering; scabbing sore-producing viral infection that is recurrent usually in the same area triggered by stress, sun/UV exposure and trauma.  It is infectious and can be spread from person to person by direct contact.  It is not curable, but is treatable with topical and oral medication.    Start Valtrex 1000 mg tab - 2 tabs po at first onset of symptoms, then repeat 12 hours later.    valACYclovir (VALTREX) 1000 MG tablet - Lips 2 tabs po when first onset of symptoms , then repeat 12 hours later    SEBORRHEIC KERATOSIS - Stuck-on, waxy, tan-brown papules and/or plaques  - Benign-appearing - Discussed benign etiology and prognosis. - Observe - Call for any changes   ACTINIC DAMAGE WITH PRECANCEROUS ACTINIC KERATOSES Counseling for Topical Chemotherapy Management: Patient exhibits: - Severe, confluent actinic changes with pre-cancerous actinic keratoses  that is secondary to cumulative UV radiation exposure over time - Condition that is severe; chronic, not at goal. - diffuse scaly erythematous macules and papules with underlying dyspigmentation - Discussed Prescription "Field Treatment" topical Chemotherapy for Severe, Chronic Confluent Actinic Changes with Pre-Cancerous Actinic Keratoses Field treatment involves treatment of an entire area of skin that has confluent Actinic Changes (Sun/ Ultraviolet light damage) and PreCancerous Actinic Keratoses by method of PhotoDynamic Therapy (PDT) and/or prescription Topical Chemotherapy agents such as 5-fluorouracil, 5-fluorouracil/calcipotriene, and/or imiquimod.  The purpose is to decrease the number of clinically evident and subclinical PreCancerous lesions to prevent progression to development of skin cancer by chemically destroying early precancer changes that may or may not be visible.  It has been shown to reduce the risk of developing skin cancer in the treated area. As a result of treatment, redness, scaling, crusting, and open sores may occur during treatment course. One or more than one of these methods may be used and may have to be used several times to control, suppress and eliminate the PreCancerous changes. Discussed treatment course, expected reaction, and possible side effects. - Recommend daily broad spectrum sunscreen SPF 30+ to sun-exposed areas, reapply every 2 hours as needed.  - Staying in the shade or wearing long sleeves, sun glasses (UVA+UVB protection) and wide brim hats (4-inch brim around the entire circumference of the hat) are also recommended. - Call for new or changing lesions.  Patient deferred starting treatment topical chemo at this time.  May reconsider in future.    HISTORY OF PRECANCEROUS ACTINIC KERATOSIS at left temple  and left upper forehead - site(s) of PreCancerous Actinic Keratosis clear today. - these may recur and new lesions may form requiring treatment to  prevent transformation into skin cancer - observe for new or changing spots and contact [facility] for appointment if occur - photoprotection with sun protective clothing; sunglasses and broad spectrum sunscreen with SPF of at least 30 + and frequent self skin exams recommended - yearly exams by a dermatologist recommended for persons with history of PreCancerous Actinic Keratoses    Return for tbse in january .  I, Asher Muir, CMA, am acting as scribe for Willeen Niece, MD.   Documentation: I have reviewed the above documentation for accuracy and completeness, and I agree with the above.  Willeen Niece, MD

## 2023-06-25 ENCOUNTER — Other Ambulatory Visit: Payer: Self-pay | Admitting: Family Medicine

## 2023-06-25 DIAGNOSIS — E785 Hyperlipidemia, unspecified: Secondary | ICD-10-CM

## 2023-06-26 ENCOUNTER — Ambulatory Visit
Admission: RE | Admit: 2023-06-26 | Discharge: 2023-06-26 | Disposition: A | Discharge status: Home / Self Care | Payer: Commercial Managed Care - PPO | Source: Ambulatory Visit | Attending: Family Medicine | Admitting: Family Medicine

## 2023-06-26 DIAGNOSIS — E785 Hyperlipidemia, unspecified: Secondary | ICD-10-CM | POA: Insufficient documentation

## 2023-11-10 ENCOUNTER — Encounter: Payer: Commercial Managed Care - PPO | Admitting: Dermatology

## 2024-01-06 ENCOUNTER — Encounter: Payer: Self-pay | Admitting: Dermatology

## 2024-01-06 ENCOUNTER — Ambulatory Visit (INDEPENDENT_AMBULATORY_CARE_PROVIDER_SITE_OTHER): Payer: Commercial Managed Care - PPO | Admitting: Dermatology

## 2024-01-06 DIAGNOSIS — L235 Allergic contact dermatitis due to other chemical products: Secondary | ICD-10-CM

## 2024-01-06 DIAGNOSIS — Z7189 Other specified counseling: Secondary | ICD-10-CM

## 2024-01-06 DIAGNOSIS — W908XXA Exposure to other nonionizing radiation, initial encounter: Secondary | ICD-10-CM

## 2024-01-06 DIAGNOSIS — L821 Other seborrheic keratosis: Secondary | ICD-10-CM

## 2024-01-06 DIAGNOSIS — Z1283 Encounter for screening for malignant neoplasm of skin: Secondary | ICD-10-CM | POA: Diagnosis not present

## 2024-01-06 DIAGNOSIS — L814 Other melanin hyperpigmentation: Secondary | ICD-10-CM

## 2024-01-06 DIAGNOSIS — L578 Other skin changes due to chronic exposure to nonionizing radiation: Secondary | ICD-10-CM

## 2024-01-06 DIAGNOSIS — Z85828 Personal history of other malignant neoplasm of skin: Secondary | ICD-10-CM

## 2024-01-06 DIAGNOSIS — D1801 Hemangioma of skin and subcutaneous tissue: Secondary | ICD-10-CM

## 2024-01-06 DIAGNOSIS — Q828 Other specified congenital malformations of skin: Secondary | ICD-10-CM

## 2024-01-06 DIAGNOSIS — L309 Dermatitis, unspecified: Secondary | ICD-10-CM

## 2024-01-06 DIAGNOSIS — D229 Melanocytic nevi, unspecified: Secondary | ICD-10-CM

## 2024-01-06 DIAGNOSIS — L570 Actinic keratosis: Secondary | ICD-10-CM

## 2024-01-06 MED ORDER — CLOBETASOL PROPIONATE 0.05 % EX CREA
TOPICAL_CREAM | CUTANEOUS | 0 refills | Status: DC
Start: 2024-01-06 — End: 2024-01-06

## 2024-01-06 MED ORDER — CLOBETASOL PROPIONATE 0.05 % EX CREA
TOPICAL_CREAM | CUTANEOUS | 0 refills | Status: DC
Start: 2024-01-06 — End: 2024-09-28

## 2024-01-06 MED ORDER — FLUOROURACIL 5 % EX CREA: TOPICAL_CREAM | CUTANEOUS | 2 refills | Status: DC

## 2024-01-06 NOTE — Patient Instructions (Addendum)
 - Start 5-fluorouracil/calcipotriene cream twice a day for 4-5 days to affected areas including forehead, R cheek until reaction occurs such as redness or irritation then discontinue. Prescription sent to [facility]. Patient advised they will receive a call to purchase the medication online and have it sent to their home. Patient provided with handout reviewing treatment course and side effects and advised to call or message [place] on MyChart with any concerns.  Reviewed course of treatment and expected reaction.  Patient advised to expect inflammation and crusting and advised that erosions are possible.  Patient advised to be diligent with sun protection during and after treatment. Counseled to keep medication out of reach of children and pets.    [facility] [address] [place], [place] [address]  Phone: [phone] TOLL-FREE: [phone]     Due to recent changes in healthcare laws, you may see results of your pathology and/or laboratory studies on MyChart before the doctors have had a chance to review them. We understand that in some cases there may be results that are confusing or concerning to you. Please understand that not all results are received at the same time and often the doctors may need to interpret multiple results in order to provide you with the best plan of care or course of treatment. Therefore, we ask that you please give [place] 2 business days to thoroughly review all your results before contacting the office for clarification. Should we see a critical lab result, you will be contacted sooner.   If You Need Anything After Your Visit  If you have any questions or concerns for your doctor, please call our main line at [phone] and press option 4 to reach your doctor's medical assistant. If no one answers, please leave a voicemail as directed and we will return your call as soon as possible. Messages left after 4 pm will be answered the following business  day.   You may also send [place] a message via MyChart. We typically respond to MyChart messages within 1-2 business days.  For prescription refills, please ask your pharmacy to contact our office. Our fax number is [phone].  If you have an urgent issue when the clinic is closed that cannot wait until the next business day, you can page your doctor at the number below.    Please note that while we do our best to be available for urgent issues outside of office hours, we are not available 24/7.   If you have an urgent issue and are unable to reach [place], you may choose to seek medical care at your doctor's office, retail clinic, urgent care center, or emergency room.  If you have a medical emergency, please immediately call 911 or go to the emergency department.  Pager Numbers  - Dr. Gwen Pounds: [phone]  - Dr. Roseanne Reno: [phone]  - Dr. Katrinka Blazing: [phone]   In the event of inclement weather, please call our main line at [phone] for an update on the status of any delays or closures.  Dermatology Medication Tips: Please keep the boxes that topical medications come in in order to help keep track of the instructions about where and how to use these. Pharmacies typically print the medication instructions only on the boxes and not directly on the medication tubes.   If your medication is too expensive, please contact our office at [phone] option 4 or send [place] a message through MyChart.   We are unable to tell what your co-pay for medications will be in advance as  this is different depending on your insurance coverage. However, we may be able to find a substitute medication at lower cost or fill out paperwork to get insurance to cover a needed medication.   If a prior authorization is required to get your medication covered by your insurance company, please allow [place] 1-2 business days to complete this process.  Drug prices often vary depending on where the prescription is filled  and some pharmacies may offer cheaper prices.  The website www.goodrx.com contains coupons for medications through different pharmacies. The prices here do not account for what the cost may be with help from insurance (it may be cheaper with your insurance), but the website can give you the price if you did not use any insurance.  - You can print the associated coupon and take it with your prescription to the pharmacy.  - You may also stop by our office during regular business hours and pick up a GoodRx coupon card.  - If you need your prescription sent electronically to a different pharmacy, notify our office through [facility] or by phone at [phone] option 4.     Si Usted Necesita Algo Despus de Su Visita  Tambin puede enviarnos un mensaje a travs de Clinical cytogeneticist. Por lo general respondemos a los mensajes de MyChart en el transcurso de 1 a 2 das hbiles.  Para renovar recetas, por favor pida a su farmacia que se ponga en contacto con nuestra oficina. Annie Sable de fax es [place] [phone].  Si tiene un asunto urgente cuando la clnica est cerrada y que no puede esperar hasta el siguiente da hbil, puede llamar/localizar a su doctor(a) al nmero que aparece a continuacin.   Por favor, tenga en cuenta que aunque hacemos todo lo posible para estar disponibles para asuntos urgentes fuera del horario de [place], no estamos disponibles las 24 horas del da, los 7 [address]  [address] de la [place].   Si tiene un problema urgente y no puede comunicarse con nosotros, puede optar por buscar atencin mdica  en el consultorio de su doctor(a), en una clnica privada, en un centro de atencin urgente o en una sala de emergencias.  Si tiene Engineer, drilling, por favor llame inmediatamente al 911 o vaya a la sala de emergencias.  Nmeros de bper  - Dr. Gwen Pounds: [phone]  - Dra. Roseanne Reno: [phone]  - Dr. Katrinka Blazing: [phone]   En caso de inclemencias del tiempo, por favor llame a  Lacy Duverney principal al [phone] para una actualizacin sobre el [place] de cualquier retraso o cierre.  Consejos para la medicacin en dermatologa: Por favor, guarde las cajas en las que vienen los medicamentos de uso tpico para ayudarle a seguir las instrucciones sobre dnde y cmo usarlos. Las farmacias generalmente imprimen las instrucciones del medicamento slo en las cajas y no directamente en los tubos del [place].   Si su medicamento es muy caro, por favor, pngase en contacto con Rolm Gala llamando al [phone] y presione la opcin 4 o envenos un mensaje a travs de Clinical cytogeneticist.   No podemos decirle cul ser su copago por los medicamentos por adelantado ya que esto es diferente dependiendo de la cobertura de su seguro. Sin embargo, es posible que podamos encontrar un medicamento sustituto a Audiological scientist un formulario para que el seguro cubra el medicamento que se considera necesario.   Si se requiere una autorizacin previa para que su compaa de seguros [place] su medicamento, por favor permtanos de 1 a 2 [address]  [address]  hbiles para completar este proceso.  Los precios de los medicamentos varan con frecuencia dependiendo del Environmental consultant de dnde se surte la receta y alguna farmacias pueden ofrecer precios ms baratos.  El sitio web www.goodrx.com tiene cupones para medicamentos de Health and safety inspector. Los precios aqu no tienen en cuenta lo que podra costar con la ayuda del seguro (puede ser ms barato con su seguro), pero el sitio web puede darle el precio si no utiliz Tourist information centre manager.  - Puede imprimir el cupn correspondiente y llevarlo con su receta a la farmacia.  - Tambin puede pasar por nuestra oficina durante el horario de atencin regular y Education officer, museum una tarjeta de cupones de GoodRx.  - Si necesita que su receta se enve electrnicamente a una farmacia diferente, informe a nuestra oficina a travs de MyChart de [place] o por telfono llamando al [phone] y  presione la opcin 4.

## 2024-01-06 NOTE — Progress Notes (Signed)
 Follow-Up Visit   Subjective  Kristy Gibson is a 63 y.o. female who presents for the following: Skin Cancer Screening and Full Body Skin Exam. Hx BCC, places at forehead and hands she would like looked at, itchy. Patient states previously was using clobetasol 0.05% cream but is not helping.  The patient presents for Total-Body Skin Exam (TBSE) for skin cancer screening and mole check. The patient has spots, moles and lesions to be evaluated, some may be new or changing and the patient may have concern these could be cancer.   The following portions of the chart were reviewed this encounter and updated as appropriate: medications, allergies, medical history  Review of Systems:  No other skin or systemic complaints except as noted in HPI or Assessment and Plan.  Objective  Well appearing patient in no apparent distress; mood and affect are within normal limits.  A full examination was performed including scalp, head, eyes, ears, nose, lips, neck, chest, axillae, abdomen, back, buttocks, bilateral upper extremities, bilateral lower extremities, hands, feet, fingers, toes, fingernails, and toenails. All findings within normal limits unless otherwise noted below.   Exam of nails limited by presence of nail polish.   Relevant physical exam findings are noted in the Assessment and Plan.  Right Lower Leg light pink flesh patch with keratotic rim mild scale  Assessment & Plan   SKIN CANCER SCREENING PERFORMED TODAY.  ACTINIC DAMAGE - Chronic condition, secondary to cumulative UV/sun exposure - diffuse scaly erythematous macules with underlying dyspigmentation - Recommend daily broad spectrum sunscreen SPF 30+ to sun-exposed areas, reapply every 2 hours as needed.  - Staying in the shade or wearing long sleeves, sun glasses (UVA+UVB protection) and wide brim hats (4-inch brim around the entire circumference of the hat) are also recommended for sun protection.  - Call for new or changing  lesions.  LENTIGINES, SEBORRHEIC KERATOSES, HEMANGIOMAS - Benign normal skin lesions - Benign-appearing - Call for any changes  MELANOCYTIC NEVI - Tan-brown and/or pink-flesh-colored symmetric macules and papules - Benign appearing on exam today - Observation - Call clinic for new or changing moles - Recommend daily use of broad spectrum spf 30+ sunscreen to sun-exposed areas.   HISTORY OF BASAL CELL CARCINOMA OF THE SKIN 2009- post neck - No evidence of recurrence today - Recommend regular full body skin exams - Recommend daily broad spectrum sunscreen SPF 30+ to sun-exposed areas, reapply every 2 hours as needed.  - Call if any new or changing lesions are noted between office visits  ACTINIC DAMAGE WITH PRECANCEROUS ACTINIC KERATOSES Counseling for Topical Chemotherapy Management: Patient exhibits: - Severe, confluent actinic changes with pre-cancerous actinic keratoses that is secondary to cumulative UV radiation exposure over time - Condition that is severe; chronic, not at goal. - diffuse scaly erythematous macules and papules with underlying dyspigmentation - Discussed Prescription "Field Treatment" topical Chemotherapy for Severe, Chronic Confluent Actinic Changes with Pre-Cancerous Actinic Keratoses Field treatment involves treatment of an entire area of skin that has confluent Actinic Changes (Sun/ Ultraviolet light damage) and PreCancerous Actinic Keratoses by method of PhotoDynamic Therapy (PDT) and/or prescription Topical Chemotherapy agents such as 5-fluorouracil, 5-fluorouracil/calcipotriene, and/or imiquimod.  The purpose is to decrease the number of clinically evident and subclinical PreCancerous lesions to prevent progression to development of skin cancer by chemically destroying early precancer changes that may or may not be visible.  It has been shown to reduce the risk of developing skin cancer in the treated area. As a result of treatment, redness,  scaling, crusting,  and open sores may occur during treatment course. One or more than one of these methods may be used and may have to be used several times to control, suppress and eliminate the PreCancerous changes. Discussed treatment course, expected reaction, and possible side effects. - Recommend daily broad spectrum sunscreen SPF 30+ to sun-exposed areas, reapply every 2 hours as needed.  - Staying in the shade or wearing long sleeves, sun glasses (UVA+UVB protection) and wide brim hats (4-inch brim around the entire circumference of the hat) are also recommended. - Call for new or changing lesions.   - Start 5-fluorouracil/calcipotriene cream twice a day for 4-5 days to affected areas including forehead, R cheek until reaction occurs such as redness or irritation then discontinue. Prescription sent to [facility]. Patient advised they will receive a call to purchase the medication online and have it sent to their home. Patient provided with handout reviewing treatment course and side effects and advised to call or message [place] on MyChart with any concerns.   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 Discussed considering patch testing if after removing gel nails not resolved.  Topical steroids (such as triamcinolone, fluocinolone, fluocinonide, mometasone, clobetasol, halobetasol, betamethasone, hydrocortisone) can cause thinning and lightening of the skin if they are used for too long in the same area. Your physician has selected the right strength medicine for your problem and area affected on the body. Please use your medication only as directed by your physician to prevent side effects.    POROKERATOSIS Right Lower Leg HAND DERMATITIS   Related Medications clobetasol cream (TEMOVATE) 0.05 % Apply to affected hands qd-bid prn, Avoid applying to face, groin, and axilla. Use as directed. Long-term use can cause thinning of the skin. MULTIPLE BENIGN NEVI   LENTIGINES   ACTINIC ELASTOSIS   SEBORRHEIC KERATOSES   CHERRY ANGIOMA   ALLERGIC DERMATITIS DUE TO OTHER CHEMICAL PRODUCT   ACTINIC KERATOSES     Return in about 1 year (around 01/05/2025) for TBSE, Hx BCC, AK follow-up. 2-3 month follow up for AKs and nail fold dermatitis  I, Soundra Pilon, CMA, am acting as scribe for Elie Goody, MD .   Documentation: I have reviewed the above documentation for accuracy and completeness, and I agree with the above.  Elie Goody, MD

## 2024-08-03 ENCOUNTER — Other Ambulatory Visit: Payer: Self-pay | Admitting: Otolaryngology

## 2024-09-28 ENCOUNTER — Encounter: Payer: Self-pay | Admitting: Otolaryngology

## 2024-10-03 NOTE — Discharge Instructions (Signed)

## 2024-10-03 NOTE — Anesthesia Preprocedure Evaluation (Signed)
 Anesthesia Evaluation  Patient identified by MRN, date of birth, ID band Patient awake    Reviewed: Allergy & Precautions, H&P , NPO status , Patient's Chart, lab work & pertinent test results  History of Anesthesia Complications (+) PONV and history of anesthetic complications  Airway Mallampati: II  TM Distance: >3 FB Neck ROM: Full    Dental no notable dental hx. (+) Caps No caps on front teeth, upper or lower:   Pulmonary neg pulmonary ROS, asthma    Pulmonary exam normal breath sounds clear to auscultation       Cardiovascular negative cardio ROS Normal cardiovascular exam Rhythm:Regular Rate:Normal     Neuro/Psych negative neurological ROS  negative psych ROS   GI/Hepatic negative GI ROS, Neg liver ROS,GERD  ,,  Endo/Other  negative endocrine ROS    Renal/GU negative Renal ROS  negative genitourinary   Musculoskeletal negative musculoskeletal ROS (+) Arthritis ,    Abdominal   Peds negative pediatric ROS (+)  Hematology negative hematology ROS (+)   Anesthesia Other Findings Asthma GERD PONV Hx Basal call carcinoma Ordered SCD, but our SCD is not working and we do not have access to another one at this time in [facility].  Have notified repairs, and ask them to expedite  Reproductive/Obstetrics negative OB ROS                              Anesthesia Physical Anesthesia Plan  ASA: 2  Anesthesia Plan: General ETT   Post-op Pain Management:    Induction: Intravenous  PONV Risk Score and Plan:   Airway Management Planned: Oral ETT  Additional Equipment:   Intra-op Plan:   Post-operative Plan: Extubation in OR  Informed Consent: I have reviewed the patients History and Physical, chart, labs and discussed the procedure including the risks, benefits and alternatives for the proposed anesthesia with the patient or authorized representative who has indicated his/her  understanding and acceptance.     Dental Advisory Given  Plan Discussed with: Anesthesiologist, CRNA and Surgeon  Anesthesia Plan Comments: (Patient consented for risks of anesthesia including but not limited to:  - adverse reactions to medications - damage to eyes, teeth, lips or other oral mucosa - nerve damage due to positioning  - sore throat or hoarseness - Damage to heart, brain, nerves, lungs, other parts of body or loss of life  Patient voiced understanding and assent.)         Anesthesia Quick Evaluation

## 2024-10-05 ENCOUNTER — Other Ambulatory Visit: Payer: Self-pay

## 2024-10-05 ENCOUNTER — Encounter: Payer: Self-pay | Admitting: Otolaryngology

## 2024-10-05 ENCOUNTER — Ambulatory Visit
Admission: RE | Admit: 2024-10-05 | Discharge: 2024-10-05 | Disposition: A | Attending: Otolaryngology | Admitting: Otolaryngology

## 2024-10-05 ENCOUNTER — Encounter
Admission: RE | Disposition: A | Discharge status: Home / Self Care | Payer: Self-pay | Source: Home / Self Care | Attending: Otolaryngology

## 2024-10-05 ENCOUNTER — Ambulatory Visit: Payer: Self-pay | Admitting: Anesthesiology

## 2024-10-05 HISTORY — DX: Nausea with vomiting, unspecified: R11.2

## 2024-10-05 HISTORY — DX: Other complications of anesthesia, initial encounter: T88.59XA

## 2024-10-05 HISTORY — DX: Gastro-esophageal reflux disease without esophagitis: K21.9

## 2024-10-05 HISTORY — DX: Unspecified osteoarthritis, unspecified site: M19.90

## 2024-10-05 HISTORY — PX: TURBINATE REDUCTION: SHX6157

## 2024-10-05 HISTORY — PX: SEPTOPLASTY: SHX2393

## 2024-10-05 SURGERY — SEPTOPLASTY, NOSE
Anesthesia: General | Site: Nose | Laterality: Bilateral

## 2024-10-05 MED ORDER — AMOXICILLIN-POT CLAVULANATE 875-125 MG PO TABS: 1.0000 | ORAL_TABLET | Freq: Two times a day (BID) | ORAL | 0 refills | Status: AC

## 2024-10-05 MED ORDER — PROPOFOL 10 MG/ML IV BOLUS
INTRAVENOUS | Status: DC | PRN
  Administered 2024-10-05: 180 mg via INTRAVENOUS

## 2024-10-05 MED ORDER — LIDOCAINE-EPINEPHRINE 1 %-1:100000 IJ SOLN
INTRAMUSCULAR | Status: DC | PRN
  Administered 2024-10-05: 7 mL

## 2024-10-05 MED ORDER — FENTANYL CITRATE (PF) 100 MCG/2ML IJ SOLN
INTRAMUSCULAR | Status: AC
  Filled 2024-10-05: qty 2

## 2024-10-05 MED ORDER — DEXAMETHASONE SODIUM PHOSPHATE 4 MG/ML IJ SOLN
INTRAMUSCULAR | Status: AC
  Filled 2024-10-05: qty 1

## 2024-10-05 MED ORDER — TRIPLE ANTIBIOTIC 3.5-400-5000 EX OINT
TOPICAL_OINTMENT | CUTANEOUS | Status: DC | PRN
  Administered 2024-10-05: 1 via TOPICAL

## 2024-10-05 MED ORDER — LACTATED RINGERS IV SOLN: INTRAVENOUS | Status: DC

## 2024-10-05 MED ORDER — EPHEDRINE SULFATE (PRESSORS) 25 MG/5ML IV SOSY
PREFILLED_SYRINGE | INTRAVENOUS | Status: DC | PRN
  Administered 2024-10-05: 10 mg via INTRAVENOUS
  Administered 2024-10-05: 5 mg via INTRAVENOUS

## 2024-10-05 MED ORDER — MIDAZOLAM HCL 5 MG/5ML IJ SOLN
INTRAMUSCULAR | Status: DC | PRN
  Administered 2024-10-05: 2 mg via INTRAVENOUS

## 2024-10-05 MED ORDER — SCOPOLAMINE 1 MG/3DAYS TD PT72
MEDICATED_PATCH | TRANSDERMAL | Status: AC
  Filled 2024-10-05: qty 1

## 2024-10-05 MED ORDER — ONDANSETRON HCL 4 MG/2ML IJ SOLN
4.0000 mg | Freq: Once | INTRAMUSCULAR | Status: AC
  Administered 2024-10-05: 4 mg via INTRAVENOUS

## 2024-10-05 MED ORDER — ONDANSETRON HCL 4 MG PO TABS: 4.0000 mg | ORAL_TABLET | Freq: Three times a day (TID) | ORAL | 0 refills | Status: AC | PRN

## 2024-10-05 MED ORDER — MIDAZOLAM HCL 2 MG/2ML IJ SOLN
INTRAMUSCULAR | Status: AC
  Filled 2024-10-05: qty 2

## 2024-10-05 MED ORDER — DEXAMETHASONE SODIUM PHOSPHATE 4 MG/ML IJ SOLN
INTRAMUSCULAR | Status: DC | PRN
  Administered 2024-10-05: 8 mg via INTRAVENOUS

## 2024-10-05 MED ORDER — ROCURONIUM BROMIDE 10 MG/ML (PF) SYRINGE
PREFILLED_SYRINGE | INTRAVENOUS | Status: AC
  Filled 2024-10-05: qty 10

## 2024-10-05 MED ORDER — ACETAMINOPHEN 10 MG/ML IV SOLN
INTRAVENOUS | Status: AC
  Filled 2024-10-05: qty 100

## 2024-10-05 MED ORDER — LIDOCAINE HCL (PF) 2 % IJ SOLN
INTRAMUSCULAR | Status: AC
  Filled 2024-10-05: qty 5

## 2024-10-05 MED ORDER — SUCCINYLCHOLINE CHLORIDE 200 MG/10ML IV SOSY
PREFILLED_SYRINGE | INTRAVENOUS | Status: AC
  Filled 2024-10-05: qty 10

## 2024-10-05 MED ORDER — ONDANSETRON HCL 4 MG/2ML IJ SOLN
INTRAMUSCULAR | Status: DC | PRN
  Administered 2024-10-05: 4 mg via INTRAVENOUS

## 2024-10-05 MED ORDER — OXYCODONE-ACETAMINOPHEN 5-325 MG PO TABS: 1.0000 | ORAL_TABLET | ORAL | 0 refills | Status: AC | PRN

## 2024-10-05 MED ORDER — PROPOFOL 1000 MG/100ML IV EMUL
INTRAVENOUS | Status: AC
  Filled 2024-10-05: qty 100

## 2024-10-05 MED ORDER — OXYCODONE HCL 5 MG PO TABS
ORAL_TABLET | ORAL | Status: AC
  Filled 2024-10-05: qty 2

## 2024-10-05 MED ORDER — SCOPOLAMINE 1 MG/3DAYS TD PT72: 1.0000 | MEDICATED_PATCH | Freq: Once | TRANSDERMAL | Status: DC

## 2024-10-05 MED ORDER — FENTANYL CITRATE (PF) 100 MCG/2ML IJ SOLN
INTRAMUSCULAR | Status: DC | PRN
  Administered 2024-10-05: 50 ug via INTRAVENOUS

## 2024-10-05 MED ORDER — ACETAMINOPHEN 10 MG/ML IV SOLN
INTRAVENOUS | Status: DC | PRN
  Administered 2024-10-05: 1000 mg via INTRAVENOUS

## 2024-10-05 MED ORDER — SUCCINYLCHOLINE CHLORIDE 200 MG/10ML IV SOSY
PREFILLED_SYRINGE | INTRAVENOUS | Status: DC | PRN
  Administered 2024-10-05: 140 mg via INTRAVENOUS

## 2024-10-05 MED ORDER — ONDANSETRON HCL 4 MG/2ML IJ SOLN
INTRAMUSCULAR | Status: AC
  Filled 2024-10-05: qty 2

## 2024-10-05 MED ORDER — EPHEDRINE 5 MG/ML INJ
INTRAVENOUS | Status: AC
  Filled 2024-10-05: qty 5

## 2024-10-05 MED ORDER — LIDOCAINE HCL (CARDIAC) PF 100 MG/5ML IV SOSY
PREFILLED_SYRINGE | INTRAVENOUS | Status: DC | PRN
  Administered 2024-10-05: 100 mg via INTRAVENOUS

## 2024-10-05 MED ORDER — OXYCODONE HCL 5 MG PO TABS
10.0000 mg | ORAL_TABLET | Freq: Once | ORAL | Status: AC
  Administered 2024-10-05: 10 mg via ORAL

## 2024-10-05 MED ORDER — OXYMETAZOLINE HCL 0.05 % NA SOLN
NASAL | Status: DC | PRN
  Administered 2024-10-05: 1

## 2024-10-05 MED ORDER — PROPOFOL 10 MG/ML IV BOLUS
INTRAVENOUS | Status: AC
  Filled 2024-10-05: qty 20

## 2024-10-05 MED ORDER — PROPOFOL 500 MG/50ML IV EMUL
INTRAVENOUS | Status: DC | PRN
  Administered 2024-10-05: 25 ug/kg/min via INTRAVENOUS

## 2024-10-05 MED ORDER — ROCURONIUM BROMIDE 100 MG/10ML IV SOLN
INTRAVENOUS | Status: DC | PRN
  Administered 2024-10-05: 5 mg via INTRAVENOUS

## 2024-10-05 SURGICAL SUPPLY — 18 items
CANISTER SUCT 1200ML W/VALVE (MISCELLANEOUS) ×1 IMPLANT
COAGULATOR SUCTION 6 10FR HC (MISCELLANEOUS) ×1 IMPLANT
DRESSING NASL FOAM PST OP SINU (MISCELLANEOUS) IMPLANT
ELECTRODE REM PT RTRN 9FT ADLT (ELECTROSURGICAL) ×1 IMPLANT
GLOVE PI ULTRA LF STRL 7.5 (GLOVE) ×2 IMPLANT
GOWN STRL REUS W/ TWL LRG LVL3 (GOWN DISPOSABLE) ×1 IMPLANT
KIT TURNOVER KIT A (KITS) ×1 IMPLANT
NDL HYPO 25GX1X1/2 BEV (NEEDLE) ×1 IMPLANT
NS IRRIG 500ML POUR BTL (IV SOLUTION) ×1 IMPLANT
PACK ENT CUSTOM (PACKS) ×1 IMPLANT
PATTIES SURGICAL .5 X3 (DISPOSABLE) ×1 IMPLANT
SOLUTION ANTFG W/FOAM PAD STRL (MISCELLANEOUS) ×1 IMPLANT
SPLINT NASAL SEPTAL BLV .50 ST (MISCELLANEOUS) ×1 IMPLANT
SUT CHROMIC 4 0 RB 1X27 (SUTURE) ×1 IMPLANT
SUT ETHILON 3-0 KS 30 BLK (SUTURE) IMPLANT
SUTURE EHLN 3-0 FS-10 30 BLK (SUTURE) ×1 IMPLANT
SYR 10ML LL (SYRINGE) ×1 IMPLANT
TOWEL OR 17X26 4PK STRL BLUE (TOWEL DISPOSABLE) ×1 IMPLANT

## 2024-10-05 NOTE — Anesthesia Postprocedure Evaluation (Signed)
 Anesthesia Post Note  Patient: Alveena Taira  Procedure(s) Performed: SEPTOPLASTY, NOSE (Bilateral: Nose) REDUCTION, NASAL TURBINATE (Bilateral: Nose)  Patient location during evaluation: PACU Anesthesia Type: General Level of consciousness: awake and alert Pain management: pain level controlled Vital Signs Assessment: post-procedure vital signs reviewed and stable Respiratory status: spontaneous breathing, nonlabored ventilation, respiratory function stable and patient connected to nasal cannula oxygen Cardiovascular status: blood pressure returned to baseline and stable Postop Assessment: no apparent nausea or vomiting Anesthetic complications: no   No notable events documented.   Last Vitals:  Vitals:   10/05/24 1022 10/05/24 1042  BP: 137/74 130/87  Pulse: 71 67  Resp: 13   Temp: 37 C 36.7 C  SpO2: 100%     Last Pain:  Vitals:   10/05/24 1042  TempSrc:   PainSc: 3                  Neilah Fulwider C Jeff Mccallum

## 2024-10-05 NOTE — H&P (Signed)
 ..  History and Physical paper copy reviewed and updated date of procedure and will be scanned into system.  Patient seen and examined.

## 2024-10-05 NOTE — Op Note (Signed)
 ..10/05/2024  10:11 AM    Arland Llano  [id number]    Pre-Op Dx:  Deviated Nasal Septum, Hypertrophic Inferior Turbinates  Post-op Dx: Same  Proc: Nasal Septoplasty, Bilateral Partial Reduction Inferior Turbinates   Surg:  Jacquita Mulhearn  Anes:  GOT  EBL:  25ml  Comp:  none  Findings: Severe left sided septal deviation with impaction onto inferior turbinate.  Right sided bone and cartilaginous deviation superiorly.  Bilateral soft tissue and bone turbinate hypertrophy.  Procedure: With the patient in a comfortable supine position,  general orotracheal anesthesia was induced without difficulty.  The patient received preoperative Afrin spray for topical decongestion and vasoconstriction.  At an appropriate level, the patient was placed in a semi-sitting position.  Nasal vibrissae were trimmed.   1% Xylocaine with 1:100,000 epinephrine, 7 cc's, was infiltrated into the anterior floor of the nose, into the nasal spine region, into the membranous columella, and finally into the submucoperichondrial plane of the septum on both sides.  Several minutes were allowed for this to take effect.  Cottoniod pledgets soaked in Afrin were placed into both nasal cavities and left while the patient was prepped and draped in the standard fashion.    The materials were removed from the nose and observed to be intact and correct in number.  The nose was inspected with a headlight and zero degree endoscope with the findings as described above.  A left Killian incision was sharply executed and carried down to the caudal edge of the quadrangular cartilage with a 15 blade scalpel.  A mucoperichondrial flap was elevated along the quadrangular plate back to the bony-cartilaginous junction using caudal elevator and freer elevator. The mucoperiosteum was then elevated along the ethmoid plate and the vomer. An intracartilaginous incision was made using the freer elevator and a contralateral mucoperichondrial  flap was elevated using a freer elevator.  Care was taken to avoid any large rents or opposing rents in the mucoperichondrial flap.  Boney spurs of the vomer and maxillary crest were removed with Takahashi forceps.  The area of cartilaginous deviation was removed with combination of freer elevator and Takahashi forceps creating a widely patent nasal cavity as well as resolution of obstruction from the cartilaginous deviation. The mucosal flaps were placed back into their anatomic position to allow visualization of the airways. The septum now sat in the midline with an improved airway.  A 4-0 Chromic was used to close the Guttenberg incision as well.   The inferior turbinates were then inspected.  Under endoscopic visualization, the inferior turbinates were in fractured bilaterally with a Therapist, nutritional.  A kelly clamp was attached to the anterior-inferior third of each inferior turbinate for approximately one minute.  Under endoscopic visualization, Tru-cutting forceps were used to remove the anterior-inferior third of each inferior turbinate.  Electrocautery was used to control bleeding in the area. The remaining turbinate was then out fractured to open up the airway further. There was no significant bleeding noted. The right turbinate was then trimmed and out fractured in a similar fashion.  The airways were then visualized and showed open passageways on both sides that were significantly improved compared to before surgery.       There was no significant bleeding. Nasal splints were applied to both sides of the septum using Xomed 0.86mm regular sized splints that were trimmed, and then held in position with a 3-0 Nylon through and through suture.  Stammberger sinufoam was placed along the cut edge of the inferior turbinates bilaterally.  The patient was turned back over to anesthesia, and awakened, extubated, and taken to the PACU in satisfactory condition.  Dispo:   PACU to home  Plan: Ice,  elevation, narcotic analgesia, steroid taper, and prophylactic antibiotics for the duration of indwelling nasal foreign bodies.  We will reevaluate the patient in the office in 6 days and remove the septal splints.  Return to work in 10 days, strenuous activities in two weeks.   Carolee Hien Cunliffe 10/05/2024 10:11 AM

## 2024-10-05 NOTE — Transfer of Care (Signed)
 Immediate Anesthesia Transfer of Care Note  Patient: Kristy Gibson  Procedure(s) Performed: SEPTOPLASTY, NOSE (Bilateral: Nose) REDUCTION, NASAL TURBINATE (Bilateral: Nose)  Patient Location: PACU  Anesthesia Type: General ETT  Level of Consciousness: awake, alert  and patient cooperative  Airway and Oxygen Therapy: Patient Spontanous Breathing and Patient connected to supplemental oxygen  Post-op Assessment: Post-op Vital signs reviewed, Patient's Cardiovascular Status Stable, Respiratory Function Stable, Patent Airway and No signs of Nausea or vomiting  Post-op Vital Signs: Reviewed and stable  Complications: No notable events documented.

## 2024-10-05 NOTE — Anesthesia Procedure Notes (Signed)
 Procedure Name: Intubation Date/Time: 10/05/2024 9:01 AM  Performed by: Myra Lawless, CRNAPre-anesthesia Checklist: Patient identified, Patient being monitored, Timeout performed, Emergency Drugs available and Suction available Patient Re-evaluated:Patient Re-evaluated prior to induction Oxygen Delivery Method: Circle system utilized Preoxygenation: Pre-oxygenation with 100% oxygen Induction Type: IV induction Ventilation: Mask ventilation without difficulty Laryngoscope Size: Mac and 3 Grade View: Grade I Tube type: Oral Tube size: 6.5 mm Number of attempts: 1 Airway Equipment and Method: Stylet Placement Confirmation: ETT inserted through vocal cords under direct vision, positive ETCO2 and breath sounds checked- equal and bilateral Secured at: 20 cm Tube secured with: Tape Dental Injury: Teeth and Oropharynx as per pre-operative assessment

## 2025-02-14 ENCOUNTER — Encounter: Admitting: Dermatology
